# Patient Record
Sex: Male | Born: 1995 | Race: White | Hispanic: No | Marital: Single | Smoking: Never smoker
Health system: Southern US, Community
[De-identification: ages and names within clinical notes are randomized; demographics above are authoritative.]

## PROBLEM LIST (undated history)

## (undated) DIAGNOSIS — J45909 Unspecified asthma, uncomplicated: Secondary | ICD-10-CM

---
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
## 2014-05-31 DIAGNOSIS — K5900 Constipation, unspecified: Secondary | ICD-10-CM | POA: Insufficient documentation

## 2016-03-16 NOTE — ED Provider Notes (Signed)
 History   Chief Complaint  Patient presents with  . Abdominal Pain   Patient presents to the ED POV accompanied by friend in room 6 with a chief complaint of abdominal pain. Pt reports that he has been having L lower abd cramping consistently for the last 5 night.  Pt reports that he has history of constipation and is currently feeling like he may be constipated.  Pt also reports that he has a headache    The history is provided by the patient. No language interpreter was used.  Abdominal Pain  Pain location:  LLQ Pain quality: cramping   Pain radiates to:  Does not radiate Pain severity:  Mild Onset quality:  Gradual Duration:  5 days Timing:  Constant Progression:  Worsening Chronicity:  Recurrent Relieved by:  Nothing Worsened by:  Nothing Ineffective treatments:  None tried Associated symptoms: constipation, nausea and vomiting   Associated symptoms: no chest pain, no chills, no cough, no diarrhea, no dysuria, no fever and no shortness of breath     Past Medical History:  Diagnosis Date  . Abnormal renal ultrasound   . Allergic rhinitis 04/21/2011  . Constipation 05/31/2014  . Hypertensive disorder   . Irritable bowel syndrome   . Obesity   . Proteinuria     Past Surgical History:  Procedure Laterality Date  . RENAL BIOPSY    . TYMPANOSTOMY TUBE PLACEMENT      Family History  Problem Relation Age of Onset  . Hypertension Mother   . Diabetes Mother   . Hypertension Maternal Aunt   . Hypertension Maternal Uncle   . Heart disease Maternal Uncle     MI at age 17  . Hypertension Maternal Grandmother   . Diabetes Maternal Grandmother   . Hypertension Maternal Grandfather   . Diabetes Maternal Grandfather   . Stroke Maternal Grandfather     age 72  . Heart disease Maternal Uncle     MI at age 80    Social History  Substance Use Topics  . Smoking status: Passive Smoke Exposure - Never Smoker  . Smokeless tobacco: Never Used     Comment: exposed to 2nd hand  smoke  . Alcohol use Not on file     Review of Systems  Constitutional: Negative for chills and fever.  Respiratory: Negative for cough and shortness of breath.   Cardiovascular: Negative for chest pain and leg swelling.  Gastrointestinal: Positive for constipation, nausea and vomiting. Negative for abdominal pain and diarrhea.  Genitourinary: Negative for difficulty urinating and dysuria.  Musculoskeletal: Negative for arthralgias and myalgias.  Neurological: Negative for weakness, numbness and headaches.  All other systems reviewed and are negative.     Physical Exam   ED Triage Vitals [03/16/16 1341]  BP 146/91  MAP (mmHg)   Pulse 89  Resp 18  Temp 97.5 F (36.4 C)  SpO2 98 %    Physical Exam  Constitutional: He appears well-developed and well-nourished. No distress.  HENT:  Head: Normocephalic and atraumatic.  Nose: Nose normal.  Mouth/Throat: Oropharynx is clear and moist. Mucous membranes are dry.  Eyes: Conjunctivae are normal. No scleral icterus.  Neck: Normal range of motion. Neck supple.  Cardiovascular: Normal rate and regular rhythm.   Pulmonary/Chest: Effort normal and breath sounds normal.  Abdominal: Soft. There is no tenderness.  Musculoskeletal: He exhibits no edema or deformity.  Neurological: He is alert. He exhibits normal muscle tone.  Skin: Skin is warm and dry.  Psychiatric: He has a  normal mood and affect. His behavior is normal.  Nursing note and vitals reviewed.   ED Course  Procedures  Labs Reviewed  LIPASE - Abnormal; Notable for the following:       Result Value   Lipase 60 (*)    All other components within normal limits  UA, MICROSCOPIC IF INDICATED BY DIPSTICK - Abnormal; Notable for the following:    Urine Bilirubin Small (*)    All other components within normal limits  CBC WITH AUTO DIFFERENTIAL PANEL - Abnormal; Notable for the following:    WBC 10.6 (*)    RBC 5.66 (*)    Monocyte % 17 (*)    Monocyte Absolute 1.8 (*)     All other components within normal limits  COMPREHENSIVE METABOLIC PANEL - Normal  CBC AND DIFFERENTIAL   Narrative:    The following orders were created for panel order CBC and Differential. Procedure                               Abnormality         Status                    ---------                               -----------         ------                    CBC and Differential[[id number]]         Abnormal            Final result               Please view results for these tests on the individual orders.    No orders to display     MDM 21 year old male with history of irritable bowel syndrome presents in the setting of concern for constipation and left lower quadrant abdominal pain.  Patient reports her last 3 days he has had intermittent loose stools but he feels like he is constipated.  Patient reports some mild pain in left lower quadrant additionally difficulty sleeping due to discomfort.  Patient denies fevers, dysuria, blood in stool, any medications.  On arrival patient was hemodynamically stable and afebrile.  Examination patient is resting comfortably and had no significant tenderness palpation of abdomen.  Differential diagnosis includes constipation, diverticulitis, pancreatitis, gastritis, enteritis, worsening of IBS.  Patient given IV fluids and pain medication and basic laboratory analysis obtained.  No significant electrolyte Routledge noted no signs of pancreatitis noted no significant anemia noted.  Mild elevation white blood cell count noted.  Patient was resting comfortably on reassessment and in setting this finding I have concern for possible constipation versus IBS.  Patient's presentation not consistent with diverticulitis and do not believe patient requires further invasive imaging.  Patient reported improvement with fluids and pain medications and will discharge patient home with plan to increase MiraLAX use and follow-up with gastroenterologist for further  management of this condition.  Additionally patient given instructions on sleep hygiene as well as plan to use melatonin for further management of sleep disorder.  Strict return precautions given and patient stable at time of discharge  Clinical Impression:  1. Constipation, unspecified constipation type   2. LLQ abdominal pain   3. Insomnia, unspecified type  ED Disposition    ED Disposition Condition Comment   Discharge  Xavyer Steenson Odden discharge to home/self care.           This document serves as a record of services personally performed by Dr.Seymore. It was created on their behalf by Michaelle Jenkins Holt, Medical Scribe, a trained medical scribe. The creation of this record is the provider's dictation and/or activities during the visit.   Electronically signed by: Michaelle Jenkins Holt, Medical Scribe 03/16/2016 3:33 PM     Electronically signed by: Prentice Ivonne Shearing, MD 03/16/16 [phone]

## 2021-02-07 NOTE — ED Triage Notes (Signed)
 Pt with covid 3 weeks ago, having asthma issues with cold weather. Pt states he is having shob and chest tightness.

## 2021-02-25 ENCOUNTER — Observation Stay (HOSPITAL_COMMUNITY)
Admission: EM | Admit: 2021-02-25 | Discharge: 2021-02-27 | Disposition: A | Discharge status: Home / Self Care | Payer: Self-pay | Attending: Emergency Medicine | Admitting: Emergency Medicine

## 2021-02-25 ENCOUNTER — Emergency Department (HOSPITAL_COMMUNITY): Payer: Self-pay

## 2021-02-25 ENCOUNTER — Encounter (HOSPITAL_COMMUNITY): Payer: Self-pay | Admitting: Emergency Medicine

## 2021-02-25 DIAGNOSIS — Z20822 Contact with and (suspected) exposure to covid-19: Secondary | ICD-10-CM | POA: Insufficient documentation

## 2021-02-25 DIAGNOSIS — I10 Essential (primary) hypertension: Secondary | ICD-10-CM | POA: Insufficient documentation

## 2021-02-25 DIAGNOSIS — J4551 Severe persistent asthma with (acute) exacerbation: Secondary | ICD-10-CM

## 2021-02-25 DIAGNOSIS — Z87891 Personal history of nicotine dependence: Secondary | ICD-10-CM | POA: Insufficient documentation

## 2021-02-25 DIAGNOSIS — R739 Hyperglycemia, unspecified: Secondary | ICD-10-CM | POA: Insufficient documentation

## 2021-02-25 DIAGNOSIS — J9601 Acute respiratory failure with hypoxia: Secondary | ICD-10-CM | POA: Insufficient documentation

## 2021-02-25 DIAGNOSIS — J45901 Unspecified asthma with (acute) exacerbation: Principal | ICD-10-CM | POA: Diagnosis present

## 2021-02-25 HISTORY — DX: Unspecified asthma, uncomplicated: J45.909

## 2021-02-25 LAB — BASIC METABOLIC PANEL
Anion gap: 5 (ref 5–15)
BUN: 14 mg/dL (ref 6–20)
CO2: 25 mmol/L (ref 22–32)
Calcium: 9.1 mg/dL (ref 8.9–10.3)
Chloride: 105 mmol/L (ref 98–111)
Creatinine, Ser: 0.66 mg/dL (ref 0.61–1.24)
GFR, Estimated: >60 mL/min (ref >60)
Glucose, Bld: 131 mg/dL — ABNORMAL HIGH (ref 70–99)
Potassium: 4 mmol/L (ref 3.5–5.1)
Sodium: 135 mmol/L (ref 135–145)

## 2021-02-25 LAB — CBC WITH DIFFERENTIAL/PLATELET
Abs Immature Granulocytes: 0.02 10*3/uL (ref 0.00–0.07)
Basophils Absolute: 0.1 10*3/uL (ref 0.0–0.1)
Basophils Relative: 1 %
Eosinophils Absolute: 2 10*3/uL — ABNORMAL HIGH (ref 0.0–0.5)
Eosinophils Relative: 21 %
HCT: 47.3 % (ref 39.0–52.0)
Hemoglobin: 15.2 g/dL (ref 13.0–17.0)
Immature Granulocytes: 0 %
Lymphocytes Relative: 12 %
Lymphs Abs: 1.1 10*3/uL (ref 0.7–4.0)
MCH: 27.2 pg (ref 26.0–34.0)
MCHC: 32.1 g/dL (ref 30.0–36.0)
MCV: 84.6 fL (ref 80.0–100.0)
Monocytes Absolute: 1 10*3/uL (ref 0.1–1.0)
Monocytes Relative: 10 %
Neutro Abs: 5.5 10*3/uL (ref 1.7–7.7)
Neutrophils Relative %: 56 %
Platelets: 223 10*3/uL (ref 150–400)
RBC: 5.59 MIL/uL (ref 4.22–5.81)
RDW: 13.2 % (ref 11.5–15.5)
WBC: 9.7 10*3/uL (ref 4.0–10.5)
nRBC: 0 % (ref 0.0–0.2)

## 2021-02-25 LAB — RESP PANEL BY RT-PCR (FLU A&B, COVID) ARPGX2
Influenza A by PCR: NEGATIVE
Influenza B by PCR: NEGATIVE
SARS Coronavirus 2 by RT PCR: NEGATIVE

## 2021-02-25 MED ORDER — METHYLPREDNISOLONE SODIUM SUCC 125 MG IJ SOLR
60.0000 mg | Freq: Two times a day (BID) | INTRAMUSCULAR | Status: DC
  Administered 2021-02-26: 60 mg via INTRAVENOUS
  Filled 2021-02-25: qty 2

## 2021-02-25 MED ORDER — ALBUTEROL SULFATE (2.5 MG/3ML) 0.083% IN NEBU
10.0000 mg/h | INHALATION_SOLUTION | Freq: Once | RESPIRATORY_TRACT | Status: AC
  Administered 2021-02-25: 10 mg/h via RESPIRATORY_TRACT
  Filled 2021-02-25: qty 3

## 2021-02-25 MED ORDER — MAGNESIUM SULFATE 2 GM/50ML IV SOLN
2.0000 g | Freq: Once | INTRAVENOUS | Status: AC
  Administered 2021-02-25: 2 g via INTRAVENOUS
  Filled 2021-02-25: qty 50

## 2021-02-25 MED ORDER — SODIUM CHLORIDE 0.9 % IV SOLN
1.0000 g | INTRAVENOUS | Status: DC
  Administered 2021-02-25: 1 g via INTRAVENOUS
  Filled 2021-02-25 (×2): qty 10

## 2021-02-25 MED ORDER — METHYLPREDNISOLONE SODIUM SUCC 125 MG IJ SOLR
125.0000 mg | Freq: Once | INTRAMUSCULAR | Status: AC
  Administered 2021-02-25: 125 mg via INTRAVENOUS
  Filled 2021-02-25: qty 2

## 2021-02-25 MED ORDER — ALBUTEROL SULFATE (2.5 MG/3ML) 0.083% IN NEBU
10.0000 mg/h | INHALATION_SOLUTION | RESPIRATORY_TRACT | Status: DC
  Administered 2021-02-25: 10 mg/h via RESPIRATORY_TRACT
  Filled 2021-02-25: qty 12

## 2021-02-25 MED ORDER — ENOXAPARIN SODIUM 60 MG/0.6ML IJ SOSY
60.0000 mg | PREFILLED_SYRINGE | INTRAMUSCULAR | Status: DC
  Administered 2021-02-26: 60 mg via SUBCUTANEOUS
  Filled 2021-02-25 (×2): qty 0.6

## 2021-02-25 MED ORDER — GUAIFENESIN ER 600 MG PO TB12
1200.0000 mg | ORAL_TABLET | Freq: Two times a day (BID) | ORAL | Status: DC
  Administered 2021-02-25 – 2021-02-27 (×4): 1200 mg via ORAL
  Filled 2021-02-25 (×4): qty 2

## 2021-02-25 MED ORDER — IPRATROPIUM-ALBUTEROL 0.5-2.5 (3) MG/3ML IN SOLN
3.0000 mL | Freq: Once | RESPIRATORY_TRACT | Status: AC
Start: 2021-02-25 — End: 2021-02-25
  Administered 2021-02-25: 3 mL via RESPIRATORY_TRACT
  Filled 2021-02-25: qty 3

## 2021-02-25 MED ORDER — SODIUM CHLORIDE 3 % IN NEBU
4.0000 mL | INHALATION_SOLUTION | Freq: Two times a day (BID) | RESPIRATORY_TRACT | Status: DC
  Filled 2021-02-25: qty 4

## 2021-02-25 MED ORDER — IPRATROPIUM-ALBUTEROL 0.5-2.5 (3) MG/3ML IN SOLN: 3.0000 mL | Freq: Four times a day (QID) | RESPIRATORY_TRACT | Status: DC

## 2021-02-25 MED ORDER — AZITHROMYCIN 250 MG PO TABS
250.0000 mg | ORAL_TABLET | Freq: Every day | ORAL | Status: DC
  Administered 2021-02-26: 250 mg via ORAL
  Filled 2021-02-25: qty 1

## 2021-02-25 MED ORDER — IPRATROPIUM BROMIDE 0.02 % IN SOLN
0.5000 mg | Freq: Once | RESPIRATORY_TRACT | Status: AC
  Administered 2021-02-25: 0.5 mg via RESPIRATORY_TRACT
  Filled 2021-02-25: qty 2.5

## 2021-02-25 MED ORDER — SODIUM CHLORIDE 3 % IN NEBU
4.0000 mL | INHALATION_SOLUTION | Freq: Two times a day (BID) | RESPIRATORY_TRACT | Status: AC
Start: 2021-02-25 — End: 2021-02-27
  Administered 2021-02-26 – 2021-02-27 (×3): 4 mL via RESPIRATORY_TRACT
  Filled 2021-02-25 (×4): qty 4

## 2021-02-25 MED ORDER — AZITHROMYCIN 250 MG PO TABS
500.0000 mg | ORAL_TABLET | Freq: Every day | ORAL | Status: AC
  Administered 2021-02-25: 500 mg via ORAL
  Filled 2021-02-25: qty 2

## 2021-02-25 MED ORDER — ACETAMINOPHEN 325 MG PO TABS: 650.0000 mg | ORAL_TABLET | Freq: Four times a day (QID) | ORAL | Status: DC | PRN

## 2021-02-25 MED ORDER — ONDANSETRON HCL 4 MG/2ML IJ SOLN: 4.0000 mg | Freq: Four times a day (QID) | INTRAMUSCULAR | Status: DC | PRN

## 2021-02-25 MED ORDER — ALBUTEROL SULFATE (2.5 MG/3ML) 0.083% IN NEBU
10.0000 mg/h | INHALATION_SOLUTION | Freq: Once | RESPIRATORY_TRACT | Status: AC
  Administered 2021-02-25: 10 mg/h via RESPIRATORY_TRACT
  Filled 2021-02-25: qty 3
  Filled 2021-02-25: qty 12

## 2021-02-25 MED ORDER — INSULIN ASPART 100 UNIT/ML IJ SOLN
0.0000 [IU] | Freq: Three times a day (TID) | INTRAMUSCULAR | Status: DC
  Filled 2021-02-25: qty 0.06

## 2021-02-25 NOTE — ED Triage Notes (Signed)
Patient c/o multiple recent asthma exacerbations. States using inhaler with some relief.

## 2021-02-25 NOTE — H&P (Signed)
History and Physical  Cody Ward YWV:[id number] DOB: 1995/03/01 DOA: 02/25/2021  Referring physician: Renne Crigler, EDP-PA  PCP: Pcp, No  Outpatient Specialists: None. Patient coming from: Home.  Chief Complaint: Shortness of breath.  HPI: Cody Ward is a 26 y.o. male with medical history significant for hypertension, GERD, asthma, tobacco use disorder, quit tobacco use and switched to vaping, obesity, who presented to [facility] ED with complaints of sudden onset shortness of breath similar but worse than previous asthma attacks.  Associated with a productive cough and chest congestion for weeks.  Reports having uncontrolled asthma for the past 2 years with frequent recurrent attacks.  Endorses use of vaping, started 2 years ago.  He is not aware of any particular triggers but states that when he lies down flat or when he is exposed to cold air his asthma tends to flare up.  He has a rescue inhaler.  Does not follow with pulmonary.  He presented to the ED for further evaluation and management of his symptomatology.  Upon presentation to the ED, O2 saturation in the mid 80s on room air.  Not on oxygen supplementation at baseline.  Patient placed on 2 L to maintain O2 saturation greater than 90%.  [facility], hospitalist team, called to admit.  ED Course: Temperature 97.8.  BP 148/78, pulse 93, respiratory rate 20, O2 saturation 90% on 2 L.  Lab studies are essentially unremarkable.  Review of Systems: Review of systems as noted in the HPI. All other systems reviewed and are negative.   Past Medical History:  Diagnosis Date   Asthma    History reviewed. No pertinent surgical history.  Social History:  has no history on file for tobacco use, alcohol use, and drug use.   No Known Allergies  Family history:   Prior to Admission medications   Not on File    Physical Exam: BP (!) 135/91    Pulse 90    Temp 97.8 F (36.6 C) (Oral)    Resp 20    Ht 5\' 11"  (1.803 m)    Wt 127 kg    SpO2 90%     BMI 39.05 kg/m   General: 26 y.o. year-old male well developed well nourished in no acute distress.  Alert and oriented x3. Cardiovascular: Regular rate and rhythm with no rubs or gallops.  No thyromegaly or JVD noted.  Trace lower extremity edema. 2/4 pulses in all 4 extremities. Respiratory: Diffuse wheezing bilaterally. Good inspiratory effort. Abdomen: Soft nontender nondistended with normal bowel sounds x4 quadrants. Muskuloskeletal: No cyanosis or clubbing.  Trace edema noted bilaterally Neuro: CN II-XII intact, strength, sensation, reflexes Skin: No ulcerative lesions noted or rashes Psychiatry: Judgement and insight appear normal. Mood is appropriate for condition and setting          Labs on Admission:  Basic Metabolic Panel: Recent Labs  Lab 02/25/21 1118  NA 135  K 4.0  CL 105  CO2 25  GLUCOSE 131*  BUN 14  CREATININE 0.66  CALCIUM 9.1   Liver Function Tests: No results for input(s): AST, ALT, ALKPHOS, BILITOT, PROT, ALBUMIN in the last 168 hours. No results for input(s): LIPASE, AMYLASE in the last 168 hours. No results for input(s): AMMONIA in the last 168 hours. CBC: Recent Labs  Lab 02/25/21 1118  WBC 9.7  NEUTROABS 5.5  HGB 15.2  HCT 47.3  MCV 84.6  PLT 223   Cardiac Enzymes: No results for input(s): CKTOTAL, CKMB, CKMBINDEX, TROPONINI in the last 168 hours.  BNP (last 3 results) No results for input(s): BNP in the last 8760 hours.  ProBNP (last 3 results) No results for input(s): PROBNP in the last 8760 hours.  CBG: No results for input(s): GLUCAP in the last 168 hours.  Radiological Exams on Admission: DG Chest 2 View  Result Date: 02/25/2021 CLINICAL DATA:  26 year old male with history of dyspnea. EXAM: CHEST - 2 VIEW COMPARISON:  Chest x-ray 02/07/2021. FINDINGS: Lung volumes are normal. No consolidative airspace disease. No pleural effusions. No pneumothorax. No pulmonary nodule or mass noted. Pulmonary vasculature and the  cardiomediastinal silhouette are within normal limits. IMPRESSION: No radiographic evidence of acute cardiopulmonary disease. Electronically Signed   By: Trudie Reed M.D.   On: 02/25/2021 11:35    EKG: I independently viewed the EKG done and my findings are as followed: None available at the time of the visit.  Assessment/Plan Present on Admission:  Asthma exacerbation  Principal Problem:   Asthma exacerbation  Acute asthma exacerbation with concern for chronic bronchitis Likely triggered by vaping Received IV steroids, bronchodilators in the ED, continue. Start IV Solu-Medrol 60 mg twice daily DuoNeb every 6 hours Endorses a productive cough and chest congestion for weeks:  Start pulmonary toilet/hypersaline nebs/Mucinex/chest physiotherapy.  Z-Pak, Rocephin x3 days.  Acute hypoxemic respiratory failure secondary to above Not on oxygen supplementation at baseline. Currently on 2 L to maintain O2 saturation greater than 90% Wean off oxygen supplementation as tolerated Personally reviewed chest x-ray done on admission which shows no acute cardiopulmonary findings.  Essential hypertension BP is not at goal, elevated Norvasc 5 mg daily Closely monitor vital signs  Obesity BMI 39 Recommend weight loss outpatient with regular physical activity and healthy dieting.  Hyperglycemia likely exacerbated by IV steroids Sensitive insulin sliding scale for hyperglycemia while on IV steroids Obtain A1c   DVT prophylaxis: Subcu Lovenox daily  Code Status: Full code  Family Communication: Significant other at bedside  Disposition Plan: Admitted to telemetry unit  Consults called: Please consult pulmonary in the morning  Admission status: Observation status.   Status is: Observation        Darlin Drop MD [facility] Pager [phone]  If 7PM-7AM, please contact night-coverage [email or web address] Password [facility]  02/25/2021, 5:27 PM

## 2021-02-25 NOTE — ED Provider Notes (Signed)
Signout from [employer] at shift change. Briefly, patient presents for asthma exacerbation with hypoxia.   Plan: Currently receiving continuous albuterol nebulizer.  Will reassess after completion.   4:07 PM Reassessment performed. Patient appears slightly uncomfortable.  He is talking in full sentences.  He has significant wheezing on exam.  On pulse ox while standing in the room he is 90-94%.  Labs and imaging personally reviewed and interpreted including: Chest x-ray, agree no acute findings.   Most current vital signs reviewed and are as follows: BP (!) 135/91    Pulse 90    Temp 97.8 F (36.6 C) (Oral)    Resp 20    Ht 5\' 11"  (1.803 m)    Wt 127 kg    SpO2 90%    BMI 39.05 kg/m   Plan: We will now check pulse oximetry with ambulation.  4:32 PM O2 sat 86-87% while moving around the room.  Patient placed on 2 L O2 by cannula.  His work of breathing is not bad.  Shared decision-making discussion had with patient.  Recommended admission to hospital.  Also discussed discharge AGAINST MEDICAL ADVICE with medication for home.  But given hypoxia, patient agrees to admission.  4:49 PM consulted with Dr. , [facility] hospital[facility], who will see patient.      Margo Aye, PA-C 02/25/21 1649    [place], 02/27/21, MD 02/25/21 [phone]

## 2021-02-25 NOTE — ED Provider Triage Note (Signed)
Emergency Medicine Provider Triage Evaluation Note  Cody Ward , a 26 y.o. male  was evaluated in triage.  Pt complains of difficulty breathing.  Patient reports history of recurrent asthma exacerbations.  Patient reports this episode started yesterday and has progressively worsened through his shift at work.  Patient reports he does have albuterol inhaler at home which he has been using without significant relief.  Denies recent illness.  Review of Systems  Positive: As above Negative: As above  Physical Exam  BP (!) 148/85 (BP Location: Left Arm)    Pulse 88    Temp 97.8 F (36.6 C) (Oral)    Resp 20    Ht 5\' 11"  (1.803 m)    Wt 127 kg    SpO2 92%    BMI 39.05 kg/m  Gen:   Awake, no distress   Resp:  Normal effort  MSK:   Moves extremities without difficulty  Other:    Medical Decision Making  Medically screening exam initiated at 11:09 AM.  Appropriate orders placed.  Cody Ward was informed that the remainder of the evaluation will be completed by another provider, this initial triage assessment does not replace that evaluation, and the importance of remaining in the ED until their evaluation is complete.     Cody Crane, PA-C 02/25/21 1110

## 2021-02-25 NOTE — ED Provider Notes (Signed)
I provided a substantive portion of the care of this patient.  I personally performed the entirety of the exam for this encounter.     Patient has history of asthma.  He reports he had a period from childhood asthma until adulthood when it had improved and he was not having flareups.  He reports for about 2 years now he has been getting extensively more serious flareups of asthma.  He reports it seems like his home inhalers just are not working anymore.  Reports over the past 3 days he has had difficulty sleeping at night.  He reports he at first was waking up coughing, now he spent most of the night awake because it was difficult to breathe.  He reports he is getting extremely fatigued at work due to shortness of breath.  He denies he had any fever that he is aware of.  No productive cough.  No lower extremity swelling or calf pain.  I am seeing the patient after he is already had a continuous nebulizer treatment and 1 DuoNeb.  At this time he is getting another continuous treatment started by respiratory.  Patient is alert with clear mental status.  He does have increased work of breathing but does not show signs of imminent fatigue.  He is speaking in moderate full sentences.  Airway patent.  Has diffuse inspiratory expiratory wheeze throughout the lung fields.  He is aerating to the bases.  Abdomen soft.  Lower extremities no peripheral edema.  No calf tenderness.  Patient is having significantly worsening and persistent asthma.  Due to financial concerns patient is requesting that he get treatment in the emergency department and be discharged to complete treatment at home.  At this time (15: 05) I have examined the patient and advised him I see very low probability of him improving to the extent that he could be safely discharged.  He is currently getting a second continuous treatment.  We will continue to monitor closely.  Unless the patient makes DRAMATIC improvement, he is not safe for discharge due  to risk of death.  Patient is aware that this is my opinion and leaving is not safe.  He agrees with inpatient treatment under these conditions.   Arby Barrette, MD 02/25/21 1511

## 2021-02-25 NOTE — ED Notes (Signed)
Patient ambulated to the bathroom and around his room. O2 sats remained in the upper 80's. He reports feeling better than before.

## 2021-02-25 NOTE — ED Provider Notes (Signed)
[place] DEPT Provider Note   CSN: [id number] Arrival date & time: 02/25/21  1032     History  Chief Complaint  Patient presents with   Asthma    Cody Ward is a 26 y.o. male with a history of hypertension, asthma, GERD.  Patient presents emergency department with a chief complaint of shortness of breath and wheezing.  Patient states that he has been having frequent asthma exacerbations since December 2022.  Patient states that his breathing became worse last night.  Patient states that he had significant wheezing.  Patient uses prescribed albuterol inhaler with minimal improvement in his symptoms.  Patient endorses rhinorrhea, nasal congestion, and nonproductive cough.  Patient denies any chest pain, leg swelling or tenderness, hemoptysis, fever.  Patient endorses using a nicotine vaporizer.  Reports former smoker however has not smoked in 2 years.   Asthma Associated symptoms include shortness of breath. Pertinent negatives include no chest pain, no abdominal pain and no headaches.      Home Medications Prior to Admission medications   Not on File      Allergies    Patient has no known allergies.    Review of Systems   Review of Systems  Constitutional:  Positive for chills. Negative for fever.  HENT:  Positive for congestion and rhinorrhea. Negative for sore throat.   Eyes:  Negative for visual disturbance.  Respiratory:  Positive for cough, chest tightness, shortness of breath and wheezing.   Cardiovascular:  Negative for chest pain, palpitations and leg swelling.  Gastrointestinal:  Negative for abdominal pain, nausea and vomiting.  Musculoskeletal:  Negative for back pain and neck pain.  Skin:  Negative for color change and rash.  Neurological:  Negative for dizziness, syncope, light-headedness and headaches.  Psychiatric/Behavioral:  Negative for confusion.    Physical Exam Updated Vital Signs BP (!) 148/85 (BP Location: Left  Arm)    Pulse 88    Temp 97.8 F (36.6 C) (Oral)    Resp 20    Ht 5\' 11"  (1.803 m)    Wt 127 kg    SpO2 92%    BMI 39.05 kg/m  Physical Exam Vitals and nursing note reviewed.  Constitutional:      General: He is not in acute distress.    Appearance: He is not ill-appearing, toxic-appearing or diaphoretic.  HENT:     Head: Normocephalic.  Eyes:     General: No scleral icterus.       Right eye: No discharge.        Left eye: No discharge.  Cardiovascular:     Rate and Rhythm: Normal rate.  Pulmonary:     Effort: No tachypnea, bradypnea or respiratory distress.     Breath sounds: Examination of the right-upper field reveals wheezing. Examination of the left-upper field reveals wheezing. Examination of the right-middle field reveals wheezing. Examination of the left-middle field reveals wheezing. Examination of the right-lower field reveals wheezing. Examination of the left-lower field reveals wheezing. Wheezing present.     Comments: Inspiratory and expiratory wheezing noted to all lung fields.  Shallow breaths.  Speaks in full sentences without difficulty Skin:    General: Skin is warm and dry.  Neurological:     General: No focal deficit present.     Mental Status: He is alert.     GCS: GCS eye subscore is 4. GCS verbal subscore is 5. GCS motor subscore is 6.  Psychiatric:        Behavior: Behavior  is cooperative.    ED Results / Procedures / Treatments   Labs (all labs ordered are listed, but only abnormal results are displayed) Labs Reviewed  CBC WITH DIFFERENTIAL/PLATELET - Abnormal; Notable for the following components:      Result Value   Eosinophils Absolute 2.0 (*)    All other components within normal limits  BASIC METABOLIC PANEL - Abnormal; Notable for the following components:   Glucose, Bld 131 (*)    All other components within normal limits  RESP PANEL BY RT-PCR (FLU A&B, COVID) [id number]    EKG None  Radiology DG Chest 2 View  Result Date:  02/25/2021 CLINICAL DATA:  26 year old male with history of dyspnea. EXAM: CHEST - 2 VIEW COMPARISON:  Chest x-ray 02/07/2021. FINDINGS: Lung volumes are normal. No consolidative airspace disease. No pleural effusions. No pneumothorax. No pulmonary nodule or mass noted. Pulmonary vasculature and the cardiomediastinal silhouette are within normal limits. IMPRESSION: No radiographic evidence of acute cardiopulmonary disease. Electronically Signed   By: Vinnie Langton M.D.   On: 02/25/2021 11:35    Procedures .Critical Care Performed by: Loni Beckwith, PA-C Authorized by: Loni Beckwith, PA-C   Critical care provider statement:    Critical care time (minutes):  30   Critical care was necessary to treat or prevent imminent or life-threatening deterioration of the following conditions:  Respiratory failure   Critical care was time spent personally by me on the following activities:  Development of treatment plan with patient or surrogate, evaluation of patient's response to treatment, examination of patient, ordering and review of laboratory studies, ordering and review of radiographic studies, ordering and performing treatments and interventions, pulse oximetry, re-evaluation of patient's condition and review of old charts Comments:     Patient has asthma exacerbation requiring magnesium, Solu-Medrol, and multiple rounds of albuterol nebulizer treatments    Medications Ordered in ED Medications  magnesium sulfate IVPB 2 g 50 mL (2 g Intravenous New Bag/Given 02/25/21 1350)  albuterol (PROVENTIL,VENTOLIN) solution continuous neb (has no administration in time range)  ipratropium (ATROVENT) nebulizer solution 0.5 mg (has no administration in time range)  ipratropium-albuterol (DUONEB) 0.5-2.5 (3) MG/3ML nebulizer solution 3 mL (3 mLs Nebulization Given 02/25/21 1137)  methylPREDNISolone sodium succinate (SOLU-MEDROL) 125 mg/2 mL injection 125 mg (125 mg Intravenous Given 02/25/21 1346)   albuterol (PROVENTIL) (2.5 MG/3ML) 0.083% nebulizer solution (10 mg/hr Nebulization Given 02/25/21 1337)    ED Course/ Medical Decision Making/ A&P                           Medical Decision Making  This patient presents to the ED for concern of wheezing and shortness of breath, this involves an extensive number of treatment options, and is a complaint that carries with it a high risk of complications and morbidity.  The differential diagnosis includes but is not limited to asthma exacerbation, acute CHF, COVID-19, influenza, viral URI, bronchitis.   Co morbidities that complicate the patient evaluation  Hypertension, asthma   Additional history obtained:  Additional history obtained from fianc at bedside External records from outside source obtained and reviewed including previous provider notes, lab work   Lab Tests:  I Ordered, and personally interpreted labs.  The pertinent results include:   Negative for influenza COVID-19 BMP unremarkable CBC shows eosinophils increased at 2.0   Imaging Studies ordered:  I ordered imaging studies including chest x-ray I independently visualized and interpreted imaging which showed no active cardiopulmonary  disease I agree with the radiologist interpretation    Medicines ordered and prescription drug management:  I ordered medication including albuterol, magnesium, and Solu-Medrol for wheezing Reevaluation of the patient after these medicines showed that the patient improved I have reviewed the patients home medicines and have made adjustments as needed   Critical Interventions:  Solu-Medrol, magnesium, multiple rounds of albuterol nebulizers for asthma exacerbation   Problem List / ED Course:  Shortness of breath and wheezing Patient has history of asthma.  States that he has been dealing with frequent asthma exacerbation since December.  Shortness of breath became worse yesterday evening. On my exam patient is noted to  have respiratory expiratory wheezing to all lung fields.  Patient has shallow breaths however is able to speak in full complete sentences.  Oxygen saturation noted to be 90 to 95% on room air. Patient ordered Solu-Medrol, magnesium, continuous albuterol x1 hour.  Patient reports improvement in chest tightness however still has expiratory expiratory wheezing to all lung fields on reexamination.  Will order second round of continuous nebulizer.  Suspect that patient will need to be admitted to the hospital due to asthma exacerbation with oxygen saturations 89 to 92% on room air.   Patient care transferred to PA Gieple at the end of my shift. Patient presentation, ED course, and plan of care discussed with review of all pertinent labs and imaging. Please see his/her note for further details regarding further ED course and disposition.  Patient was discussed with and evaluated by Dr. Vallery Ridge.    Reevaluation:  After the interventions noted above, I reevaluated the patient and found that they have :improved   Social Determinants of Health:  Patient reports that he does not have any primary care provider.   Dispostion:  After consideration of the diagnostic results and the patients response to treatment, I feel that the patent would benefit from admission.          Final Clinical Impression(s) / ED Diagnoses Final diagnoses:  None    Rx / DC Orders ED Discharge Orders     None         Loni Beckwith, PA-C 02/25/21 1545    Charlesetta Shanks, MD 02/25/21 [phone]

## 2021-02-26 ENCOUNTER — Other Ambulatory Visit: Payer: Self-pay

## 2021-02-26 ENCOUNTER — Telehealth: Payer: Self-pay | Admitting: Internal Medicine

## 2021-02-26 ENCOUNTER — Encounter (HOSPITAL_COMMUNITY): Payer: Self-pay | Admitting: Internal Medicine

## 2021-02-26 ENCOUNTER — Observation Stay (HOSPITAL_COMMUNITY): Payer: Self-pay

## 2021-02-26 DIAGNOSIS — J4551 Severe persistent asthma with (acute) exacerbation: Secondary | ICD-10-CM

## 2021-02-26 LAB — MAGNESIUM: Magnesium: 2.3 mg/dL (ref 1.7–2.4)

## 2021-02-26 LAB — BASIC METABOLIC PANEL
Anion gap: 8 (ref 5–15)
BUN: 12 mg/dL (ref 6–20)
CO2: 23 mmol/L (ref 22–32)
Calcium: 9.7 mg/dL (ref 8.9–10.3)
Chloride: 107 mmol/L (ref 98–111)
Creatinine, Ser: 0.46 mg/dL — ABNORMAL LOW (ref 0.61–1.24)
GFR, Estimated: >60 mL/min (ref >60)
Glucose, Bld: 140 mg/dL — ABNORMAL HIGH (ref 70–99)
Potassium: 4.3 mmol/L (ref 3.5–5.1)
Sodium: 138 mmol/L (ref 135–145)

## 2021-02-26 LAB — CBC
HCT: 46.9 % (ref 39.0–52.0)
Hemoglobin: 15 g/dL (ref 13.0–17.0)
MCH: 27 pg (ref 26.0–34.0)
MCHC: 32 g/dL (ref 30.0–36.0)
MCV: 84.4 fL (ref 80.0–100.0)
Platelets: 249 10*3/uL (ref 150–400)
RBC: 5.56 MIL/uL (ref 4.22–5.81)
RDW: 13.4 % (ref 11.5–15.5)
WBC: 10.9 10*3/uL — ABNORMAL HIGH (ref 4.0–10.5)
nRBC: 0 % (ref 0.0–0.2)

## 2021-02-26 LAB — GLUCOSE, CAPILLARY
Glucose-Capillary: 143 mg/dL — ABNORMAL HIGH (ref 70–99)
Glucose-Capillary: 147 mg/dL — ABNORMAL HIGH (ref 70–99)
Glucose-Capillary: 138 mg/dL — ABNORMAL HIGH (ref 70–99)
Glucose-Capillary: 152 mg/dL — ABNORMAL HIGH (ref 70–99)

## 2021-02-26 LAB — HIV ANTIBODY (ROUTINE TESTING W REFLEX): HIV Screen 4th Generation wRfx: NONREACTIVE

## 2021-02-26 LAB — HEPATIC FUNCTION PANEL
ALT: 23 U/L (ref 0–44)
AST: 22 U/L (ref 15–41)
Albumin: 4.3 g/dL (ref 3.5–5.0)
Alkaline Phosphatase: 52 U/L (ref 38–126)
Bilirubin, Direct: 0.1 mg/dL (ref 0.0–0.2)
Indirect Bilirubin: 0.3 mg/dL (ref 0.3–0.9)
Total Bilirubin: 0.4 mg/dL (ref 0.3–1.2)
Total Protein: 7.4 g/dL (ref 6.5–8.1)

## 2021-02-26 LAB — PHOSPHORUS: Phosphorus: 1.4 mg/dL — ABNORMAL LOW (ref 2.5–4.6)

## 2021-02-26 LAB — HEMOGLOBIN A1C
Hgb A1c MFr Bld: 5.8 % — ABNORMAL HIGH (ref 4.8–5.6)
Mean Plasma Glucose: 119.76 mg/dL

## 2021-02-26 LAB — LACTIC ACID, PLASMA: Lactic Acid, Venous: 3.7 mmol/L (ref 0.5–1.9)

## 2021-02-26 MED ORDER — AZITHROMYCIN 250 MG PO TABS
250.0000 mg | ORAL_TABLET | Freq: Every day | ORAL | Status: DC
  Administered 2021-02-27: 250 mg via ORAL
  Filled 2021-02-26: qty 1

## 2021-02-26 MED ORDER — MOMETASONE FURO-FORMOTEROL FUM 200-5 MCG/ACT IN AERO
2.0000 | INHALATION_SPRAY | Freq: Two times a day (BID) | RESPIRATORY_TRACT | 0 refills | Status: DC
  Filled 2021-02-26: qty 13, 30d supply, fill #0

## 2021-02-26 MED ORDER — SODIUM PHOSPHATES 45 MMOLE/15ML IV SOLN
30.0000 mmol | Freq: Once | INTRAVENOUS | Status: DC
  Filled 2021-02-26: qty 10

## 2021-02-26 MED ORDER — LACTATED RINGERS IV SOLN: INTRAVENOUS | Status: DC

## 2021-02-26 MED ORDER — POTASSIUM & SODIUM PHOSPHATES 280-160-250 MG PO PACK: 2.0000 | PACK | Freq: Three times a day (TID) | ORAL | 0 refills | Status: DC

## 2021-02-26 MED ORDER — ALBUTEROL SULFATE HFA 108 (90 BASE) MCG/ACT IN AERS
2.0000 | INHALATION_SPRAY | Freq: Four times a day (QID) | RESPIRATORY_TRACT | Status: DC | PRN
  Administered 2021-02-27: 2 via RESPIRATORY_TRACT
  Filled 2021-02-26: qty 6.7

## 2021-02-26 MED ORDER — ALBUTEROL SULFATE (2.5 MG/3ML) 0.083% IN NEBU
10.0000 mg/h | INHALATION_SOLUTION | RESPIRATORY_TRACT | 0 refills | Status: DC
  Filled 2021-02-26: qty 75, fill #0

## 2021-02-26 MED ORDER — AZITHROMYCIN 250 MG PO TABS
250.0000 mg | ORAL_TABLET | Freq: Every day | ORAL | 0 refills | Status: DC
  Filled 2021-02-26: qty 3, 3d supply, fill #0

## 2021-02-26 MED ORDER — IPRATROPIUM-ALBUTEROL 0.5-2.5 (3) MG/3ML IN SOLN
3.0000 mL | Freq: Three times a day (TID) | RESPIRATORY_TRACT | Status: DC
  Administered 2021-02-26 – 2021-02-27 (×4): 3 mL via RESPIRATORY_TRACT
  Filled 2021-02-26 (×4): qty 3

## 2021-02-26 MED ORDER — PREDNISONE 20 MG PO TABS: 60.0000 mg | ORAL_TABLET | Freq: Every day | ORAL | Status: DC

## 2021-02-26 MED ORDER — ALBUTEROL SULFATE (2.5 MG/3ML) 0.083% IN NEBU: 10.0000 mg/h | INHALATION_SOLUTION | RESPIRATORY_TRACT | 0 refills | Status: DC

## 2021-02-26 MED ORDER — POTASSIUM & SODIUM PHOSPHATES 280-160-250 MG PO PACK
2.0000 | PACK | Freq: Three times a day (TID) | ORAL | Status: DC
  Filled 2021-02-26 (×2): qty 2

## 2021-02-26 MED ORDER — PREDNISONE 50 MG PO TABS
50.0000 mg | ORAL_TABLET | Freq: Every day | ORAL | Status: DC
  Filled 2021-02-26: qty 1

## 2021-02-26 MED ORDER — IOHEXOL 350 MG/ML SOLN
75.0000 mL | Freq: Once | INTRAVENOUS | Status: AC | PRN
  Administered 2021-02-26: 75 mL via INTRAVENOUS

## 2021-02-26 MED ORDER — SODIUM PHOSPHATES 45 MMOLE/15ML IV SOLN
30.0000 mmol | Freq: Once | INTRAVENOUS | Status: AC
  Administered 2021-02-26: 30 mmol via INTRAVENOUS
  Filled 2021-02-26: qty 10

## 2021-02-26 MED ORDER — MOMETASONE FURO-FORMOTEROL FUM 200-5 MCG/ACT IN AERO
2.0000 | INHALATION_SPRAY | Freq: Two times a day (BID) | RESPIRATORY_TRACT | Status: DC
  Administered 2021-02-26 – 2021-02-27 (×2): 2 via RESPIRATORY_TRACT
  Filled 2021-02-26: qty 8.8

## 2021-02-26 MED ORDER — ALBUTEROL SULFATE HFA 108 (90 BASE) MCG/ACT IN AERS: 2.0000 | INHALATION_SPRAY | Freq: Four times a day (QID) | RESPIRATORY_TRACT | 0 refills | Status: DC | PRN

## 2021-02-26 MED ORDER — AZITHROMYCIN 250 MG PO TABS: ORAL_TABLET | ORAL | 0 refills | Status: DC

## 2021-02-26 MED ORDER — METHYLPREDNISOLONE SODIUM SUCC 125 MG IJ SOLR
125.0000 mg | INTRAMUSCULAR | Status: AC
  Administered 2021-02-26: 125 mg via INTRAVENOUS
  Filled 2021-02-26: qty 2

## 2021-02-26 MED ORDER — BENZONATATE 100 MG PO CAPS: 100.0000 mg | ORAL_CAPSULE | Freq: Three times a day (TID) | ORAL | 0 refills | Status: DC | PRN

## 2021-02-26 MED ORDER — ALBUTEROL SULFATE HFA 108 (90 BASE) MCG/ACT IN AERS
2.0000 | INHALATION_SPRAY | Freq: Four times a day (QID) | RESPIRATORY_TRACT | 0 refills | Status: DC | PRN
Start: 2021-02-26 — End: 2021-05-02
  Filled 2021-02-26: qty 8.5, 25d supply, fill #0

## 2021-02-26 MED ORDER — POTASSIUM & SODIUM PHOSPHATES 280-160-250 MG PO PACK
2.0000 | PACK | Freq: Three times a day (TID) | ORAL | Status: DC
Start: 2021-02-26 — End: 2021-02-26
  Filled 2021-02-26 (×2): qty 2

## 2021-02-26 MED ORDER — SODIUM PHOSPHATES 45 MMOLE/15ML IV SOLN
30.0000 mmol | INTRAVENOUS | Status: AC
  Administered 2021-02-26: 30 mmol via INTRAVENOUS
  Filled 2021-02-26: qty 10

## 2021-02-26 NOTE — Plan of Care (Signed)
  Problem: Clinical Measurements: Goal: Diagnostic test results will improve Outcome: Progressing   Problem: Coping: Goal: Level of anxiety will decrease Outcome: Progressing   Problem: Safety: Goal: Ability to remain free from injury will improve Outcome: Progressing   

## 2021-02-26 NOTE — Progress Notes (Signed)
°   02/26/21 1652  Provider Notification  Provider Name/Title Darlin Drop MD  Date Provider Notified 02/26/21  Time Provider Notified 1652  Notification Type Face-to-face  Notification Reason Critical result  Test performed and critical result Lactic Acid  Date Critical Result Received 04/18/21  Time Critical Result Received 1652  Provider response See new orders  Date of Provider Response 02/26/21  Time of Provider Response  (See Mar)   Face to face encounter MD Margo Aye at bedside. See MAR for new orders.

## 2021-02-26 NOTE — Progress Notes (Signed)
Discharge delayed due to critical lab values which include severe hypophosphatemia 1.4 and lactic acidosis 3.7.  Patient wants to leave so he could go to work tomorrow, but he is still requiring inpatient care.  Explained to the patient that it is imperative that he stays and allows [place] to treat him with the therapies that are already in place.  Advised that if he leaves he will be leaving AGAINST MEDICAL ADVICE.  Patient agrees to stay and wants to be discharged as soon as it is possible.

## 2021-02-26 NOTE — Consult Note (Signed)
NAME:  Cody Ward, MRN:  [id number], DOB:  03-19-1995, LOS: 0 ADMISSION DATE:  02/25/2021, CONSULTATION DATE:  02/26/2021 REFERRING MD:  Dr Irene Pap of [facility], CHIEF COMPLAINT:  Asthma exacerbation   History of Present Illness: -History from review of the chart and also talking to the patient  26 year old male with a long history of asthma since childhood [exposed to multiple cats including 3 cats currently.  Moved to a new house 6 months ago but no mold or mildew or cockroach in that house other than the cats.  Prior smoker but quit 2 tobacco and switched to vaping also with obesity and acid reflux.  He says at baseline he has significant amount of asthma.  Every night he wakes up especially early in the morning with asthma symptoms of chest tightness and cough.  But he only takes albuterol as needed.  Symptoms been particularly high in the last 1-2 years.  However he has only been on prednisone 1 time and one of the ER visit.  This is his first admission.  On this current admission he had progressive worsening of symptoms and required 2 L oxygen for pulse ox of 90%.  Started on steroids and since then has significantly improved.  Currently feels 7-8 out of 10 with 10 being the best.  He wants to go home.  Pulmonary medicine consulted for help with discharge planning with specific question about ideal maintenance regimen to send the patient home on. at the time of pulm evaluation discharge summary has been done.  Flu and COVID PCR negative.  Multiplex respiratory virus panel pending.  Morning phosphorus is extremely low.  Patient is on antibiotics with ceftriaxone and azithromycin.  Past Medical History:    has a past medical history of Asthma.   has no history on file for tobacco use.  History reviewed. No pertinent surgical history.  No Known Allergies   There is no immunization history on file for this patient.  Family History  Family history unknown: Yes     Current  Facility-Administered Medications:    acetaminophen (TYLENOL) tablet 650 mg, 650 mg, Oral, Q6H PRN, Hall, Carole N, DO   albuterol (PROVENTIL) (2.5 MG/3ML) 0.083% nebulizer solution, 10 mg/hr, Nebulization, Continuous, Loni Beckwith, PA-C, Stopped at 02/25/21 1908   [COMPLETED] azithromycin (ZITHROMAX) tablet 500 mg, 500 mg, Oral, Daily, 500 mg at 02/25/21 1855 **FOLLOWED BY** azithromycin (ZITHROMAX) tablet 250 mg, 250 mg, Oral, Daily, Hall, Carole N, DO, 250 mg at 02/26/21 0910   cefTRIAXone (ROCEPHIN) 1 g in sodium chloride 0.9 % 100 mL IVPB, 1 g, Intravenous, Q24H, Hall, Carole N, DO, Stopped at 02/25/21 2019   enoxaparin (LOVENOX) injection 60 mg, 60 mg, Subcutaneous, Q24H, Hall, Carole N, DO   guaiFENesin (MUCINEX) 12 hr tablet 1,200 mg, 1,200 mg, Oral, BID, Hall, Carole N, DO, 1,200 mg at 02/26/21 0910   insulin aspart (novoLOG) injection 0-6 Units, 0-6 Units, Subcutaneous, TID WC, Hall, Carole N, DO   ipratropium-albuterol (DUONEB) 0.5-2.5 (3) MG/3ML nebulizer solution 3 mL, 3 mL, Nebulization, TID, Hall, Carole N, DO, 3 mL at 02/26/21 1349   ondansetron (ZOFRAN) injection 4 mg, 4 mg, Intravenous, Q6H PRN, Hall, Carole N, DO   sodium chloride HYPERTONIC 3 % nebulizer solution 4 mL, 4 mL, Nebulization, BID, Hall, Carole N, DO, 4 mL at 02/26/21 0903     Significant Hospital Events:  02/25/2021 - admit   Micro Data:   Results for orders placed or performed during the hospital encounter  of 02/25/21  Resp Panel by RT-PCR (Flu A&B, Covid) Nasopharyngeal Swab     Status: None   Collection Time: 02/25/21 11:15 AM   Specimen: Nasopharyngeal Swab; Nasopharyngeal(NP) swabs in vial transport medium  Result Value Ref Range Status   SARS Coronavirus 2 by RT PCR NEGATIVE NEGATIVE Final    Comment: (NOTE) SARS-CoV-2 target nucleic acids are NOT DETECTED.  The SARS-CoV-2 RNA is generally detectable in upper respiratory specimens during the acute phase of infection. The  lowest concentration of SARS-CoV-2 viral copies this assay can detect is 138 copies/mL. A negative result does not preclude SARS-Cov-2 infection and should not be used as the sole basis for treatment or other patient management decisions. A negative result may occur with  improper specimen collection/handling, submission of specimen other than nasopharyngeal swab, presence of viral mutation(s) within the areas targeted by this assay, and inadequate number of viral copies(<138 copies/mL). A negative result must be combined with clinical observations, patient history, and epidemiological information. The expected result is Negative.  Fact Sheet for Patients:  EntrepreneurPulse.com.au  Fact Sheet for Healthcare Providers:  IncredibleEmployment.be  This test is no t yet approved or cleared by the [place] FDA and  has been authorized for detection and/or diagnosis of SARS-CoV-2 by FDA under an Emergency Use Authorization (EUA). This EUA will remain  in effect (meaning this test can be used) for the duration of the COVID-19 declaration under Section 564(b)(1) of the Act, 21 U.S.C.section 360bbb-3(b)(1), unless the authorization is terminated  or revoked sooner.       Influenza A by PCR NEGATIVE NEGATIVE Final   Influenza B by PCR NEGATIVE NEGATIVE Final    Comment: (NOTE) The Xpert Xpress SARS-CoV-2/FLU/RSV plus assay is intended as an aid in the diagnosis of influenza from Nasopharyngeal swab specimens and should not be used as a sole basis for treatment. Nasal washings and aspirates are unacceptable for Xpert Xpress SARS-CoV-2/FLU/RSV testing.  Fact Sheet for Patients: EntrepreneurPulse.com.au  Fact Sheet for Healthcare Providers: IncredibleEmployment.be  This test is not yet approved or cleared by the [place] FDA and has been authorized for detection and/or diagnosis of SARS-CoV-2 by FDA under  an Emergency Use Authorization (EUA). This EUA will remain in effect (meaning this test can be used) for the duration of the COVID-19 declaration under Section 564(b)(1) of the Act, 21 U.S.C. section 360bbb-3(b)(1), unless the authorization is terminated or revoked.  Performed at [facility], [place] [address] [place], [place] [address]      Antimicrobials:   Anti-infectives (From admission, onward)    Start     Dose/Rate Route Frequency Ordered Stop   02/26/21 1000  azithromycin (ZITHROMAX) tablet 250 mg       See Hyperspace for full Linked Orders Report.   250 mg Oral Daily 02/25/21 1723 03/02/21 0959   02/26/21 0000  azithromycin (ZITHROMAX Z-PAK) 250 MG tablet           02/26/21 1215 03/03/21 2359   02/25/21 1800  cefTRIAXone (ROCEPHIN) 1 g in sodium chloride 0.9 % 100 mL IVPB        1 g 200 mL/hr over 30 Minutes Intravenous Every 24 hours 02/25/21 1723 02/28/21 1759   02/25/21 1800  azithromycin (ZITHROMAX) tablet 500 mg       See Hyperspace for full Linked Orders Report.   500 mg Oral Daily 02/25/21 1723 02/25/21 1855        Interim History / Subjective:   02/26/2021 - seen in bed  1405  Objective   Blood pressure (!) 145/62, pulse 73, temperature 97.8 F (36.6 C), resp. rate 20, height 5' 10.87" (1.8 m), weight 126.7 kg, SpO2 90 %.        Intake/Output Summary (Last 24 hours) at 02/26/2021 1540 Last data filed at 02/25/2021 2249 Gross per 24 hour  Intake 580 ml  Output --  Net 580 ml   Filed Weights   02/25/21 1045 02/25/21 2227  Weight: 127 kg 126.7 kg    Examination: General: obese. No distress HENT: oral cavity normal Lungs: Wheezing +. No distress. Speaks full sentences. No acce muscule use Cardiovascular: normal heart sound Abdomen: obese Extremities: intact Neuro: axox3. 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  Also severely low phosphorus  P:   Await rest of the respiratory virus panel multiplex Check urine drug screen Check procalcitonin Check urine strep and urine Legionella  Replete phosphorus [should not go home without repletion]  Check blood IgE, blood seasonal allergens 02/26/2021   Change nebs to dulera 200/4.,5 at 2 puff twice daily with albuterol mdi prn at dc Change solumedrol to prednisone 50mg  per day and do taper as follows  - Please take Take prednisone 50mg  daily x 3 days, 40mg  once daily x 3 days, then 30mg  once daily x 3 days, then 20mg  once daily x 3 days, then prednisone 10mg  once daily  x 3 days and stop      ELECTROLYTES A:  Severe hypophostaemia  02/26/2021  P: Replee K Phos   Best practice (daily eval):  Per [facility]   SIGNATURE    Dr. Brand Males, M.D., F.C.C.P,  Pulmonary and Critical Care Medicine Staff Physician, [place] Director - Interstitial Lung Disease  Program  [facility] [place], [place], [address]  NPI Number:  NPI [id number]  Pager: [phone], If no answer  -> Check AMION or Try [phone] Telephone (clinical office): [phone] Telephone (research): [phone]  3:40 PM 02/26/2021   02/26/2021 3:40 PM    LABS    PULMONARY No results for input(s): PHART, PCO2ART, PO2ART, HCO3, TCO2, O2SAT  in the last 168 hours.  Invalid input(s): PCO2, PO2  CBC Recent Labs  Lab 02/25/21 1118 02/26/21 0457  HGB 15.2 15.0  HCT 47.3 46.9  WBC 9.7 10.9*  PLT 223 249    COAGULATION No results for input(s): INR in the last 168 hours.  CARDIAC  No results for input(s): TROPONINI in the last 168 hours. No results for input(s): PROBNP in the last 168 hours.  CHEMISTRY Recent Labs  Lab 02/25/21 1118 02/26/21 0457  NA 135 138  K 4.0 4.3  CL 105 107  CO2 25 23  GLUCOSE 131* 140*  BUN 14 12  CREATININE 0.66 0.46*  CALCIUM 9.1 9.7  MG  --  2.3  PHOS  --  1.4*   Estimated Creatinine Clearance: 189.4 mL/min (A) (by C-G formula based on SCr of 0.46 mg/dL (L)).   LIVER No results for input(s): AST, ALT, ALKPHOS, BILITOT, PROT, ALBUMIN, INR in the last 168 hours.   INFECTIOUS No results for input(s): LATICACIDVEN, PROCALCITON in the last 168 hours.   ENDOCRINE CBG (last 3)  Recent Labs    02/26/21 0750 02/26/21 1146  GLUCAP 143* 147*         IMAGING x48h  - image(s) personally visualized  -   highlighted in bold DG Chest 2 View  Result Date: 02/25/2021 CLINICAL DATA:  26 year old male with history of dyspnea. EXAM: CHEST - 2 VIEW COMPARISON:  Chest x-ray 02/07/2021. FINDINGS: Lung volumes are normal. No consolidative airspace disease. No pleural effusions. No pneumothorax. No pulmonary nodule or mass noted. Pulmonary vasculature and the cardiomediastinal silhouette are within normal limits. IMPRESSION: No radiographic evidence of acute cardiopulmonary disease. Electronically Signed   By: Vinnie Langton M.D.   On: 02/25/2021 11:35   CT Angio Chest Pulmonary Embolism (PE) W or WO Contrast  Result Date: 02/26/2021 CLINICAL DATA:  26 year old male with history of shortness of breath and cough for the past 2 weeks. EXAM: CT ANGIOGRAPHY CHEST WITH CONTRAST TECHNIQUE: Multidetector CT imaging of the chest was performed using the standard protocol during bolus  administration of intravenous contrast. Multiplanar CT image reconstructions and MIPs were obtained to evaluate the vascular anatomy. RADIATION DOSE REDUCTION: This exam was performed according to the departmental dose-optimization program which includes automated exposure control, adjustment of the mA and/or kV according to patient size and/or use of iterative reconstruction technique. CONTRAST:  32mL OMNIPAQUE IOHEXOL 350 MG/ML SOLN COMPARISON:  No priors. FINDINGS: Cardiovascular: No filling defect in the central, lobar or segmental sized pulmonary arteries to suggest pulmonary embolism. Smaller subsegmental sized emboli can not be completely excluded secondary to extensive patient respiratory motion. Heart size is normal. There is no significant pericardial fluid, thickening or pericardial calcification. No atherosclerotic calcifications are noted in the thoracic aorta or the coronary arteries. Mediastinum/Nodes: No pathologically enlarged mediastinal or hilar lymph nodes. Esophagus is unremarkable in appearance. No axillary lymphadenopathy. Lungs/Pleura: Ground-glass attenuation in the right middle lobe and left lower lobe is nonspecific, but likely of infectious or inflammatory etiology. No acute consolidative airspace disease. No pleural effusions. No suspicious appearing pulmonary nodules or masses are noted. Upper Abdomen: Unremarkable. Musculoskeletal: There are no aggressive appearing lytic or blastic lesions noted in the visualized portions of the skeleton. Review of the MIP images confirms the above findings. IMPRESSION: 1. Although slightly limited by patient respiratory motion, there is no evidence of clinically significant central, lobar or segmental sized pulmonary embolism. 2. Patchy areas of ground-glass attenuation are noted in the right middle lobe and left lower lobe, likely of infectious or inflammatory etiology. Electronically Signed   By: Vinnie Langton M.D.   On: 02/26/2021 12:47

## 2021-02-26 NOTE — TOC Transition Note (Addendum)
Transition of Care [facility]) - CM/SW Discharge Note   Patient Details  Name: Cody Ward MRN: [id number] Date of Birth: [birth year]  Transition of Care [facility]) CM/SW Contact:  Darleene Cleaver, LCSW Phone Number: 02/26/2021, 11:45 AM   Clinical Narrative:     Patient will be going home.  Patient has a hospital follow up scheduled for February 1st at 9:30am at the  [facility] [address]  [place] [address] [phone].  CSW was informed that patient needs medication assistance, patient did qualify for the [facility] program in which his medications will be 3 dollars each.  Patient's medications were sent to [facility].  They will deliver the meds to the room before he leaves.    Final next level of care: Home/Self Care Barriers to Discharge: Barriers Resolved   Patient Goals and CMS Choice Patient states their goals for this hospitalization and ongoing recovery are:: To return back home.      Discharge Placement  Patient will be discharging back home.                     Discharge Plan and Services                                     Social Determinants of Health (SDOH) Interventions     Readmission Risk Interventions No flowsheet data found.

## 2021-02-26 NOTE — Discharge Summary (Addendum)
Discharge Summary  Cody Ward RRN:[id number] DOB: 05-28-1995  PCP: Pcp, No  Admit date: 02/25/2021 Discharge date: 02/26/2021  Time spent: 35 minutes.  Recommendations for Outpatient Follow-up:  Follow-up with pulmonary within a week. Take your medications as prescribed.  Discharge Diagnoses:  Active Hospital Problems   Diagnosis Date Noted   Asthma exacerbation 02/25/2021    Resolved Hospital Problems  No resolved problems to display.    Discharge Condition: Stable  Diet recommendation: Resume previous diet  Vitals:   02/26/21 1349 02/26/21 1413  BP:  (!) 145/62  Pulse: 82 73  Resp: 18 20  Temp:  97.8 F (36.6 C)  SpO2: 94% 90%    History of present illness:  Is Cody Ward is a 26 y.o. male with medical history significant for hypertension, GERD, asthma, tobacco use disorder, quit tobacco use and switched to vaping, obesity, who presented to [facility] ED with complaints of sudden onset shortness of breath similar but worse than previous asthma attacks.  Associated with a productive cough and chest congestion for weeks.  Reports having uncontrolled asthma for the past 2 years with frequent recurrent attacks.  Endorses use of vaping, started 2 years ago.  He is not aware of any particular triggers but states that when he lies down flat or when he is exposed to cold air his asthma tends to flare up.  He has a rescue inhaler.  Does not follow with pulmonary.  He presented to the ED for further evaluation and management of his symptomatology.  Upon presentation to the ED, O2 saturation in the mid 80s on room air.  Not on oxygen supplementation at baseline.  Patient placed on 2 L to maintain O2 saturation greater than 90%.  TRH, hospitalist team, called to admit.   Symptomatology improved.  States he feels a lot better.  02/26/2021: Patient was seen and examined at his bedside.  He has no new complaints.  He wants to go home due to his work obligations.  Hospital Course:   Principal Problem:   Asthma exacerbation  Improved acute asthma exacerbation with concern for chronic bronchitis Suspect triggered by vaping Received IV steroids, bronchodilators. Received pulmonary toilet.   Received Z-Pak, Rocephin x2. Follow-up with pulmonary   Improved Acute hypoxemic respiratory failure secondary to above Not on oxygen supplementation at baseline. 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 Follow-up with your primary care provider   Obesity BMI 39 Recommend weight loss outpatient with regular physical activity and healthy dieting.   Hyperglycemia likely exacerbated by IV steroids Hemoglobin A1c 5.8% on 03/01/2021.  Hypophosphatemia Serum phosphorus 1.4 Repleted intravenously and orally        Code Status: Full code      Consults called: Pulmonary on 02/24/2021.     Discharge Exam: BP (!) 145/62    Pulse 73    Temp 97.8 F (36.6 C)    Resp 20    Ht 5' 10.87" (1.8 m)    Wt 126.7 kg    SpO2 90%    BMI 39.11 kg/m  General: 26 y.o. year-old male well developed well nourished in no acute distress.  Alert and oriented x3. Cardiovascular: Regular rate and rhythm with no rubs or gallops.  No  thyromegaly or JVD noted.   Respiratory: Mild diffuse wheezing bilaterally. Good inspiratory effort. Abdomen: Soft nontender nondistended with normal bowel sounds x4 quadrants. Musculoskeletal: No lower extremity edema. Skin: No ulcerative lesions noted or rashes, Psychiatry: Mood is appropriate for condition and setting  Discharge Instructions You were cared for by a hospitalist during your hospital stay. If you have any questions about your discharge medications or the care you received  while you were in the hospital after you are discharged, you can call the unit and asked to speak with the hospitalist on call if the hospitalist that took care of you is not available. Once you are discharged, your primary care physician will handle any further medical issues. Please note that NO REFILLS for any discharge medications will be authorized once you are discharged, as it is imperative that you return to your primary care physician (or establish a relationship with a primary care physician if you do not have one) for your aftercare needs so that they can reassess your need for medications and monitor your lab values.   Allergies as of 02/26/2021   No Known Allergies      Medication List     TAKE these medications    albuterol (2.5 MG/3ML) 0.083% nebulizer solution Commonly known as: PROVENTIL Take 10 mg/hr by nebulization continuous. What changed: You were already taking a medication with the same name, and this prescription was added. Make sure you understand how and when to take each.   albuterol 108 (90 Base) MCG/ACT inhaler Commonly known as: VENTOLIN HFA Inhale 2 puffs into the lungs every 6 (six) hours as needed for wheezing or shortness of breath. What changed: Another medication with the same name was added. Make sure you understand how and when to take each.   azithromycin 250 MG tablet Commonly known as: Zithromax Z-Pak Take 2 tablets (500 mg) on  Day 1,  followed by 1 tablet (250 mg) once daily on Days 2 through 5.   benzonatate 100 MG capsule Commonly known as: Tessalon Perles Take 1 capsule (100 mg total) by mouth 3 (three) times daily as needed for cough.   magnesium 30 MG tablet Take 30 mg by mouth every evening.   potassium & sodium phosphates 280-160-250 MG Pack Commonly known as: PHOS-NAK Take 2 packets by mouth 4 (four) times daily -  before meals and at bedtime for 3 days.   riboflavin 100 MG Tabs tablet Commonly known as: VITAMIN B-2 Take 100 mg  by mouth every evening.               Durable Medical Equipment  (From admission, onward)           Start     Ordered   02/26/21 1216  For home use only DME Nebulizer machine  Once       Question Answer Comment  Patient needs a nebulizer to treat with the following condition Asthma exacerbation   Length of Need 6 Months      02/26/21 1216           No Known Allergies  Follow-up Information     [facility]. Call today.   Why: Please call for a posthospital follow-up appointment. Contact information: [address] [place] [phone]                 The results of significant diagnostics from this hospitalization (including imaging, microbiology, ancillary and laboratory) are listed below  for reference.    Significant Diagnostic Studies: DG Chest 2 View  Result Date: 02/25/2021 CLINICAL DATA:  26 year old male with history of dyspnea. EXAM: CHEST - 2 VIEW COMPARISON:  Chest x-ray 02/07/2021. FINDINGS: Lung volumes are normal. No consolidative airspace disease. No pleural effusions. No pneumothorax. No pulmonary nodule or mass noted. Pulmonary vasculature and the cardiomediastinal silhouette are within normal limits. IMPRESSION: No radiographic evidence of acute cardiopulmonary disease. Electronically Signed   By: Trudie Reed M.D.   On: 02/25/2021 11:35   CT Angio Chest Pulmonary Embolism (PE) W or WO Contrast  Result Date: 02/26/2021 CLINICAL DATA:  26 year old male with history of shortness of breath and cough for the past 2 weeks. EXAM: CT ANGIOGRAPHY CHEST WITH CONTRAST TECHNIQUE: Multidetector CT imaging of the chest was performed using the standard protocol during bolus administration of intravenous contrast. Multiplanar CT image reconstructions and MIPs were obtained to evaluate the vascular anatomy. RADIATION DOSE REDUCTION: This exam was performed according to the departmental  dose-optimization program which includes automated exposure control, adjustment of the mA and/or kV according to patient size and/or use of iterative reconstruction technique. CONTRAST:  60mL OMNIPAQUE IOHEXOL 350 MG/ML SOLN COMPARISON:  No priors. FINDINGS: Cardiovascular: No filling defect in the central, lobar or segmental sized pulmonary arteries to suggest pulmonary embolism. Smaller subsegmental sized emboli can not be completely excluded secondary to extensive patient respiratory motion. Heart size is normal. There is no significant pericardial fluid, thickening or pericardial calcification. No atherosclerotic calcifications are noted in the thoracic aorta or the coronary arteries. Mediastinum/Nodes: No pathologically enlarged mediastinal or hilar lymph nodes. Esophagus is unremarkable in appearance. No axillary lymphadenopathy. Lungs/Pleura: Ground-glass attenuation in the right middle lobe and left lower lobe is nonspecific, but likely of infectious or inflammatory etiology. No acute consolidative airspace disease. No pleural effusions. No suspicious appearing pulmonary nodules or masses are noted. Upper Abdomen: Unremarkable. Musculoskeletal: There are no aggressive appearing lytic or blastic lesions noted in the visualized portions of the skeleton. Review of the MIP images confirms the above findings. IMPRESSION: 1. Although slightly limited by patient respiratory motion, there is no evidence of clinically significant central, lobar or segmental sized pulmonary embolism. 2. Patchy areas of ground-glass attenuation are noted in the right middle lobe and left lower lobe, likely of infectious or inflammatory etiology. Electronically Signed   By: Trudie Reed M.D.   On: 02/26/2021 12:47    Microbiology: Recent Results (from the past 240 hour(s))  Resp Panel by RT-PCR (Flu A&B, Covid) Nasopharyngeal Swab     Status: None   Collection Time: 02/25/21 11:15 AM   Specimen: Nasopharyngeal Swab;  Nasopharyngeal(NP) swabs in vial transport medium  Result Value Ref Range Status   SARS Coronavirus 2 by RT PCR NEGATIVE NEGATIVE Final    Comment: (NOTE) SARS-CoV-2 target nucleic acids are NOT DETECTED.  The SARS-CoV-2 RNA is generally detectable in upper respiratory specimens during the acute phase of infection. The lowest concentration of SARS-CoV-2 viral copies this assay can detect is 138 copies/mL. A negative result does not preclude SARS-Cov-2 infection and should not be used as the sole basis for treatment or other patient management decisions. A negative result may occur with  improper specimen collection/handling, submission of specimen other than nasopharyngeal swab, presence of viral mutation(s) within the areas targeted by this assay, and inadequate number of viral copies(<138 copies/mL). A negative result must be combined with clinical observations, patient history, and epidemiological information. The expected result is Negative.  Fact Sheet for Patients:  BloggerCourse.comhttps://www.fda.gov/media/152166/download  Fact Sheet for Healthcare Providers:  SeriousBroker.ithttps://www.fda.gov/media/152162/download  This test is no t yet approved or cleared by the [place] FDA and  has been authorized for detection and/or diagnosis of SARS-CoV-2 by FDA under an Emergency Use Authorization (EUA). This EUA will remain  in effect (meaning this test can be used) for the duration of the COVID-19 declaration under Section 564(b)(1) of the Act, 21 U.S.C.section 360bbb-3(b)(1), unless the authorization is terminated  or revoked sooner.       Influenza A by PCR NEGATIVE NEGATIVE Final   Influenza B by PCR NEGATIVE NEGATIVE Final    Comment: (NOTE) The Xpert Xpress SARS-CoV-2/FLU/RSV plus assay is intended as an aid in the diagnosis of influenza from Nasopharyngeal swab specimens and should not be used as a sole basis for treatment. Nasal washings and aspirates are unacceptable for Xpert Xpress  SARS-CoV-2/FLU/RSV testing.  Fact Sheet for Patients: BloggerCourse.comhttps://www.fda.gov/media/152166/download  Fact Sheet for Healthcare Providers: SeriousBroker.ithttps://www.fda.gov/media/152162/download  This test is not yet approved or cleared by the [place] FDA and has been authorized for detection and/or diagnosis of SARS-CoV-2 by FDA under an Emergency Use Authorization (EUA). This EUA will remain in effect (meaning this test can be used) for the duration of the COVID-19 declaration under Section 564(b)(1) of the Act, 21 U.S.C. section 360bbb-3(b)(1), unless the authorization is terminated or revoked.  Performed at [facility], [address] [place], [place] [id number]      Labs: Basic Metabolic Panel: Recent Labs  Lab 02/25/21 1118 02/26/21 0457  NA 135 138  K 4.0 4.3  CL 105 107  CO2 25 23  GLUCOSE 131* 140*  BUN 14 12  CREATININE 0.66 0.46*  CALCIUM 9.1 9.7  MG  --  2.3  PHOS  --  1.4*   Liver Function Tests: No results for input(s): AST, ALT, ALKPHOS, BILITOT, PROT, ALBUMIN in the last 168 hours. No results for input(s): LIPASE, AMYLASE in the last 168 hours. No results for input(s): AMMONIA in the last 168 hours. CBC: Recent Labs  Lab 02/25/21 1118 02/26/21 0457  WBC 9.7 10.9*  NEUTROABS 5.5  --   HGB 15.2 15.0  HCT 47.3 46.9  MCV 84.6 84.4  PLT 223 249   Cardiac Enzymes: No results for input(s): CKTOTAL, CKMB, CKMBINDEX, TROPONINI in the last 168 hours. BNP: BNP (last 3 results) No results for input(s): BNP in the last 8760 hours.  ProBNP (last 3 results) No results for input(s): PROBNP in the last 8760 hours.  CBG: Recent Labs  Lab 02/26/21 0750 02/26/21 1146  GLUCAP 143* 147*       Signed:  Darlin Droparole N Doralee Kocak, MD [facility] 02/26/2021, 4:04 PM

## 2021-02-26 NOTE — Progress Notes (Signed)
Pt had five runs of Vtach. Face-to-face encounter with Margo Aye. Carole N. DO. Pt asymptomatic.

## 2021-02-26 NOTE — Telephone Encounter (Signed)
Cody Ward at [facility] - September 17, 1995 [address] [place] [address] - please give hospital follow-up 30 min this week for asthma and then with Dr Lenice Llamas

## 2021-02-26 NOTE — Progress Notes (Signed)
SATURATION QUALIFICATIONS: (This note is used to comply with regulatory documentation for home oxygen)  Patient Saturations on Room Air at Rest = 95%  Patient Saturations on Room Air while Ambulating = 93%  Patient Saturations on 0 Liters of oxygen while Ambulating = 0%  Please briefly explain why patient needs home oxygen: Pt not qualified for home oxygen. Pt maintained O2 levels 93-95%.

## 2021-02-27 ENCOUNTER — Other Ambulatory Visit (HOSPITAL_COMMUNITY): Payer: Self-pay

## 2021-02-27 LAB — MAGNESIUM: Magnesium: 2 mg/dL (ref 1.7–2.4)

## 2021-02-27 LAB — CBC WITH DIFFERENTIAL/PLATELET
Abs Immature Granulocytes: 0.12 10*3/uL — ABNORMAL HIGH (ref 0.00–0.07)
Basophils Absolute: 0 10*3/uL (ref 0.0–0.1)
Basophils Relative: 0 %
Eosinophils Absolute: 0 10*3/uL (ref 0.0–0.5)
Eosinophils Relative: 0 %
HCT: 43.8 % (ref 39.0–52.0)
Hemoglobin: 14.2 g/dL (ref 13.0–17.0)
Immature Granulocytes: 1 %
Lymphocytes Relative: 7 %
Lymphs Abs: 1.1 10*3/uL (ref 0.7–4.0)
MCH: 27.3 pg (ref 26.0–34.0)
MCHC: 32.4 g/dL (ref 30.0–36.0)
MCV: 84.2 fL (ref 80.0–100.0)
Monocytes Absolute: 1.4 10*3/uL — ABNORMAL HIGH (ref 0.1–1.0)
Monocytes Relative: 9 %
Neutro Abs: 13.6 10*3/uL — ABNORMAL HIGH (ref 1.7–7.7)
Neutrophils Relative %: 83 %
Platelets: 214 10*3/uL (ref 150–400)
RBC: 5.2 MIL/uL (ref 4.22–5.81)
RDW: 13.7 % (ref 11.5–15.5)
WBC: 16.2 10*3/uL — ABNORMAL HIGH (ref 4.0–10.5)
nRBC: 0 % (ref 0.0–0.2)

## 2021-02-27 LAB — PROCALCITONIN: Procalcitonin: <0.1 ng/mL

## 2021-02-27 LAB — COMPREHENSIVE METABOLIC PANEL
ALT: 21 U/L (ref 0–44)
AST: 17 U/L (ref 15–41)
Albumin: 3.8 g/dL (ref 3.5–5.0)
Alkaline Phosphatase: 42 U/L (ref 38–126)
Anion gap: 8 (ref 5–15)
BUN: 17 mg/dL (ref 6–20)
CO2: 25 mmol/L (ref 22–32)
Calcium: 9 mg/dL (ref 8.9–10.3)
Chloride: 105 mmol/L (ref 98–111)
Creatinine, Ser: 0.69 mg/dL (ref 0.61–1.24)
GFR, Estimated: >60 mL/min (ref >60)
Glucose, Bld: 114 mg/dL — ABNORMAL HIGH (ref 70–99)
Potassium: 4 mmol/L (ref 3.5–5.1)
Sodium: 138 mmol/L (ref 135–145)
Total Bilirubin: 0.5 mg/dL (ref 0.3–1.2)
Total Protein: 6.3 g/dL — ABNORMAL LOW (ref 6.5–8.1)

## 2021-02-27 LAB — PHOSPHORUS: Phosphorus: 4.7 mg/dL — ABNORMAL HIGH (ref 2.5–4.6)

## 2021-02-27 LAB — LACTIC ACID, PLASMA: Lactic Acid, Venous: 1.9 mmol/L (ref 0.5–1.9)

## 2021-02-27 LAB — GLUCOSE, CAPILLARY: Glucose-Capillary: 100 mg/dL — ABNORMAL HIGH (ref 70–99)

## 2021-02-27 MED ORDER — ALBUTEROL SULFATE (2.5 MG/3ML) 0.083% IN NEBU
2.5000 mg | INHALATION_SOLUTION | Freq: Four times a day (QID) | RESPIRATORY_TRACT | 0 refills | Status: DC | PRN
  Filled 2021-02-27: qty 90, 8d supply, fill #0

## 2021-02-27 MED ORDER — PREDNISONE 10 MG PO TABS
ORAL_TABLET | ORAL | 0 refills | Status: DC
  Filled 2021-02-27: qty 45, 15d supply, fill #0

## 2021-02-27 MED ORDER — PREDNISONE 10 MG PO TABS
ORAL_TABLET | ORAL | 0 refills | Status: DC
Start: 2021-02-27 — End: 2021-02-27
  Filled 2021-02-27: qty 30, fill #0

## 2021-02-27 MED ORDER — IPRATROPIUM-ALBUTEROL 0.5-2.5 (3) MG/3ML IN SOLN
3.0000 mL | RESPIRATORY_TRACT | Status: DC | PRN
  Administered 2021-02-27: 3 mL via RESPIRATORY_TRACT

## 2021-02-27 MED ORDER — METHYLPREDNISOLONE SODIUM SUCC 125 MG IJ SOLR
125.0000 mg | Freq: Once | INTRAMUSCULAR | Status: AC
  Administered 2021-02-27: 125 mg via INTRAVENOUS
  Filled 2021-02-27: qty 2

## 2021-02-27 NOTE — Discharge Summary (Addendum)
Discharge Summary  Cody Ward [id number] DOB: Oct 04, 1995  PCP: Pcp, No  Admit date: 02/25/2021 Discharge date: 02/27/2021  Time spent: 25 minutes.  Recommendations for Outpatient Follow-up:  Follow-up with pulmonary. Follow up with your primary care provider. Take your medications as prescribed. Continue to completely abstain from vaping.  Discharge Diagnoses:  Active Hospital Problems   Diagnosis Date Noted   Asthma exacerbation 02/25/2021    Resolved Hospital Problems  No resolved problems to display.    Discharge Condition: Stable  Diet recommendation: Resume previous diet.  Vitals:   02/27/21 0638 02/27/21 0758  BP: (!) 143/69   Pulse: 70   Resp: 19   Temp: 97.9 F (36.6 C)   SpO2: 97% 96%    History of present illness:   Is Cody Ward is a 26 y.o. male with medical history significant for hypertension, GERD, asthma, tobacco use disorder, quit tobacco use and switched to vaping, obesity, who presented to [facility] ED with complaints of sudden onset shortness of breath similar but worse than previous asthma attacks.  Associated with a productive cough and chest congestion for weeks.  Reports having uncontrolled asthma for the past 2 years with frequent recurrent attacks.  Endorses use of vaping, started 2 years ago.  He is not aware of any particular triggers but states that when he lies down flat or when he is exposed to cold air his asthma tends to flare up.  He has a rescue inhaler.  Does not follow with pulmonary.  He presented to the ED for further evaluation and management of his symptomatology.  Upon presentation to the ED, O2 saturation in the mid 80s on room air.  Not on oxygen supplementation at baseline.  Patient placed on 2 L to maintain O2 saturation greater than 90%.  TRH, hospitalist team, called to admit.   Symptomatology improved.  States he feels a lot better.  02/27/2021: Patient was seen and examined at his bedside.  There were no acute events  overnight.  He has no complaints.  He is eager to go home.  Hospital Course:  Principal Problem:   Asthma exacerbation  Resolved acute asthma exacerbation with concern for chronic bronchitis Suspect triggered by vaping Received IV steroids, bronchodilators. Received pulmonary toilet.   Received Z-Pak, Rocephin x2. Follow-up with pulmonary   Resolved acute hypoxemic respiratory failure secondary to above Not on oxygen supplementation at baseline. 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   Essential hypertension BP is at goal now that he is back on home Norvasc 5 mg daily. Follow-up with your primary care provider   Obesity BMI 39 Recommend weight loss outpatient with regular physical activity and healthy dieting.   Prediabetes with hyperglycemia likely exacerbated by IV steroids Hemoglobin A1c 5.8% on 03/01/2021.   Resolved post repletion: Hypophosphatemia Serum phosphorus 1.4> 4.7         Code Status: Full code       Consults called: Pulmonary on 02/24/2021.        Discharge Exam: BP (!) 143/69 (BP Location: Left Arm)    Pulse 70    Temp 97.9 F (36.6 C) (Oral)    Resp 19    Ht 5' 10.87" (1.8 m)    Wt 126.7 kg    SpO2 96%    BMI 39.11 kg/m  General: 25  y.o. year-old male well developed well nourished in no acute distress.  Alert and oriented x3. Cardiovascular: Regular rate and rhythm with no rubs or gallops.  No thyromegaly or JVD noted.   Respiratory: Clear to auscultation with no wheezes or rales. Good inspiratory effort. Abdomen: Soft nontender nondistended with normal bowel sounds x4 quadrants. Musculoskeletal: No lower extremity edema. 2/4 pulses in all 4 extremities. Skin: No ulcerative lesions noted or rashes, Psychiatry: Mood is appropriate for condition and  setting  Discharge Instructions You were cared for by a hospitalist during your hospital stay. If you have any questions about your discharge medications or the care you received while you were in the hospital after you are discharged, you can call the unit and asked to speak with the hospitalist on call if the hospitalist that took care of you is not available. Once you are discharged, your primary care physician will handle any further medical issues. Please note that NO REFILLS for any discharge medications will be authorized once you are discharged, as it is imperative that you return to your primary care physician (or establish a relationship with a primary care physician if you do not have one) for your aftercare needs so that they can reassess your need for medications and monitor your lab values.   Allergies as of 02/27/2021   No Known Allergies      Medication List     TAKE these medications    albuterol 108 (90 Base) MCG/ACT inhaler Commonly known as: VENTOLIN HFA Inhale 2 puffs into the lungs every 6 (six) hours as needed for wheezing or shortness of breath. What changed: Another medication with the same name was added. Make sure you understand how and when to take each.   albuterol (2.5 MG/3ML) 0.083% nebulizer solution Commonly known as: PROVENTIL Take 3 mLs (2.5 mg total) by nebulization every 6 (six) hours as needed for wheezing or shortness of breath. What changed: You were already taking a medication with the same name, and this prescription was added. Make sure you understand how and when to take each.   azithromycin 250 MG tablet Commonly known as: ZITHROMAX Take 1 tablet (250 mg total) by mouth daily for 3 days   benzonatate 100 MG capsule Commonly known as: Tessalon Perles Take 1 capsule (100 mg total) by mouth 3 (three) times daily as needed for cough.   magnesium 30 MG tablet Take 30 mg by mouth every evening.   mometasone-formoterol 200-5 MCG/ACT  Aero Commonly known as: DULERA Inhale 2 puffs into the lungs 2 (two) times daily.   predniSONE 10 MG tablet Commonly known as: DELTASONE Take 5 tablets by mouth daily x3 days, 4 tablets daily x3 days, 3 tablets daily x3 days, 2 tablets daily x3 days then 1 tablet daily x3 days, then stop.   riboflavin 100 MG Tabs tablet Commonly known as: VITAMIN B-2 Take 100 mg by mouth every evening.               Durable Medical Equipment  (From admission, onward)           Start     Ordered   02/26/21 1216  For home use only DME Nebulizer machine  Once       Question Answer Comment  Patient needs a nebulizer to treat with the following condition Asthma exacerbation   Length of Need 6 Months      02/26/21 1216           No Known Allergies  The results of significant diagnostics from this hospitalization (including imaging, microbiology, ancillary and laboratory) are listed below for reference.    Significant Diagnostic Studies: DG Chest 2 View  Result Date: 02/25/2021 CLINICAL DATA:  26 year old male with history of dyspnea. EXAM: CHEST - 2 VIEW COMPARISON:  Chest x-ray 02/07/2021. FINDINGS: Lung volumes are normal. No consolidative airspace disease. No pleural effusions. No pneumothorax. No pulmonary nodule or mass noted. Pulmonary vasculature and the cardiomediastinal silhouette are within normal limits. IMPRESSION: No radiographic evidence of acute cardiopulmonary disease. Electronically Signed   By: Vinnie Langton M.D.   On: 02/25/2021 11:35   CT Angio Chest Pulmonary Embolism (PE) W or WO Contrast  Result Date: 02/26/2021 CLINICAL DATA:  26 year old male with history of shortness of breath and cough for the past 2 weeks. EXAM: CT ANGIOGRAPHY CHEST WITH CONTRAST TECHNIQUE: Multidetector CT imaging of the chest was performed using the standard protocol during bolus administration of intravenous contrast. Multiplanar CT image reconstructions and MIPs were obtained to  evaluate the vascular anatomy. RADIATION DOSE REDUCTION: This exam was performed according to the departmental dose-optimization program which includes automated exposure control, adjustment of the mA and/or kV according to patient size and/or use of iterative reconstruction technique. CONTRAST:  29mL OMNIPAQUE IOHEXOL 350 MG/ML SOLN COMPARISON:  No priors. FINDINGS: Cardiovascular: No filling defect in the central, lobar or segmental sized pulmonary arteries to suggest pulmonary embolism. Smaller subsegmental sized emboli can not be completely excluded secondary to extensive patient respiratory motion. Heart size is normal. There is no significant pericardial fluid, thickening or pericardial calcification. No atherosclerotic calcifications are noted in the thoracic aorta or the coronary arteries. Mediastinum/Nodes: No pathologically enlarged mediastinal or hilar lymph nodes. Esophagus is unremarkable in appearance. No axillary lymphadenopathy. Lungs/Pleura: Ground-glass attenuation in the right middle lobe and left lower lobe is nonspecific, but likely of infectious or inflammatory etiology. No acute consolidative airspace disease. No pleural effusions. No suspicious appearing pulmonary nodules or masses are noted. Upper Abdomen: Unremarkable. Musculoskeletal: There are no aggressive appearing lytic or blastic lesions noted in the visualized portions of the skeleton. Review of the MIP images confirms the above findings. IMPRESSION: 1. Although slightly limited by patient respiratory motion, there is no evidence of clinically significant central, lobar or segmental sized pulmonary embolism. 2. Patchy areas of ground-glass attenuation are noted in the right middle lobe and left lower lobe, likely of infectious or inflammatory etiology. Electronically Signed   By: Vinnie Langton M.D.   On: 02/26/2021 12:47    Microbiology: Recent Results (from the past 240 hour(s))  Resp Panel by RT-PCR (Flu A&B, Covid)  Nasopharyngeal Swab     Status: None   Collection Time: 02/25/21 11:15 AM   Specimen: Nasopharyngeal Swab; Nasopharyngeal(NP) swabs in vial transport medium  Result Value Ref Range Status   SARS Coronavirus 2 by RT PCR NEGATIVE NEGATIVE Final    Comment: (NOTE) SARS-CoV-2 target nucleic acids are NOT DETECTED.  The SARS-CoV-2 RNA is generally detectable in upper respiratory specimens during the acute phase of infection. The lowest concentration of SARS-CoV-2 viral copies this assay can detect is 138 copies/mL. A negative result does not preclude SARS-Cov-2 infection and should not be used as the sole basis for treatment or other patient management decisions. A negative result may occur with  improper specimen collection/handling, submission of specimen other than nasopharyngeal swab, presence of viral mutation(s) within the areas targeted by this assay, and inadequate number of viral copies(<138 copies/mL). A negative result must be combined with  clinical observations, patient history, and epidemiological information. The expected result is Negative.  Fact Sheet for Patients:  EntrepreneurPulse.com.au  Fact Sheet for Healthcare Providers:  IncredibleEmployment.be  This test is no t yet approved or cleared by the [place] FDA and  has been authorized for detection and/or diagnosis of SARS-CoV-2 by FDA under an Emergency Use Authorization (EUA). This EUA will remain  in effect (meaning this test can be used) for the duration of the COVID-19 declaration under Section 564(b)(1) of the Act, 21 U.S.C.section 360bbb-3(b)(1), unless the authorization is terminated  or revoked sooner.       Influenza A by PCR NEGATIVE NEGATIVE Final   Influenza B by PCR NEGATIVE NEGATIVE Final    Comment: (NOTE) The Xpert Xpress SARS-CoV-2/FLU/RSV plus assay is intended as an aid in the diagnosis of influenza from Nasopharyngeal swab specimens and should not be  used as a sole basis for treatment. Nasal washings and aspirates are unacceptable for Xpert Xpress SARS-CoV-2/FLU/RSV testing.  Fact Sheet for Patients: EntrepreneurPulse.com.au  Fact Sheet for Healthcare Providers: IncredibleEmployment.be  This test is not yet approved or cleared by the [place] FDA and has been authorized for detection and/or diagnosis of SARS-CoV-2 by FDA under an Emergency Use Authorization (EUA). This EUA will remain in effect (meaning this test can be used) for the duration of the COVID-19 declaration under Section 564(b)(1) of the Act, 21 U.S.C. section 360bbb-3(b)(1), unless the authorization is terminated or revoked.  Performed at [facility], [place] [address], [place],  [address]      Labs: Basic Metabolic Panel: Recent Labs  Lab 02/25/21 1118 02/26/21 0457 02/27/21 0544  NA 135 138 138  K 4.0 4.3 4.0  CL 105 107 105  CO2 25 23 25   GLUCOSE 131* 140* 114*  BUN 14 12 17   CREATININE 0.66 0.46* 0.69  CALCIUM 9.1 9.7 9.0  MG  --  2.3 2.0  PHOS  --  1.4* 4.7*   Liver Function Tests: Recent Labs  Lab 02/26/21 1608 02/27/21 0544  AST 22 17  ALT 23 21  ALKPHOS 52 42  BILITOT 0.4 0.5  PROT 7.4 6.3*  ALBUMIN 4.3 3.8   No results for input(s): LIPASE, AMYLASE in the last 168 hours. No results for input(s): AMMONIA in the last 168 hours. CBC: Recent Labs  Lab 02/25/21 1118 02/26/21 0457 02/27/21 0544  WBC 9.7 10.9* 16.2*  NEUTROABS 5.5  --  13.6*  HGB 15.2 15.0 14.2  HCT 47.3 46.9 43.8  MCV 84.6 84.4 84.2  PLT 223 249 214   Cardiac Enzymes: No results for input(s): CKTOTAL, CKMB, CKMBINDEX, TROPONINI in the last 168 hours. BNP: BNP (last 3 results) No results for input(s): BNP in the last 8760 hours.  ProBNP (last 3 results) No results for input(s): PROBNP in the last 8760 hours.  CBG: Recent Labs  Lab 02/26/21 0750 02/26/21 1146 02/26/21 1723 02/26/21 2155  02/27/21 0737  GLUCAP 143* 147* 138* 152* 100*       Signed:  Kayleen Memos, MD [facility] 02/27/2021, 3:06 PM

## 2021-02-27 NOTE — Progress Notes (Signed)
Discharge instructions given and explained to patient, he verbalized understanding, patient denies any pain/distress. Patient waiting for home med arrangement from outpatient pharmacy.

## 2021-02-27 NOTE — Telephone Encounter (Signed)
Patient is scheduled for HFU with Rhunette Croft, NP 03/02/2021 at 12pm and scheduled with Dr. Celine Mans on 03/15/2021 at 330pm.

## 2021-02-28 ENCOUNTER — Other Ambulatory Visit (HOSPITAL_COMMUNITY): Payer: Self-pay

## 2021-02-28 NOTE — Telephone Encounter (Signed)
Thanks. Will close message °

## 2021-03-01 ENCOUNTER — Other Ambulatory Visit (HOSPITAL_COMMUNITY): Payer: Self-pay

## 2021-03-02 ENCOUNTER — Encounter: Payer: Self-pay | Admitting: Nurse Practitioner

## 2021-03-02 ENCOUNTER — Other Ambulatory Visit: Payer: Self-pay

## 2021-03-02 ENCOUNTER — Ambulatory Visit (INDEPENDENT_AMBULATORY_CARE_PROVIDER_SITE_OTHER): Payer: Self-pay | Admitting: Nurse Practitioner

## 2021-03-02 DIAGNOSIS — J4550 Severe persistent asthma, uncomplicated: Secondary | ICD-10-CM

## 2021-03-02 LAB — IGE: IgE (Immunoglobulin E), Serum: 376 IU/mL (ref 6–495)

## 2021-03-02 MED ORDER — MONTELUKAST SODIUM 10 MG PO TABS
10.0000 mg | ORAL_TABLET | Freq: Every day | ORAL | 3 refills | Status: DC
Start: 2021-03-02 — End: 2021-05-02

## 2021-03-02 NOTE — Patient Instructions (Addendum)
-  Continue Albuterol inhaler 2 puffs or 3 mL neb every 6 hours as needed for shortness of breath or wheezing. Notify if symptoms persist despite rescue inhaler/neb use. -Continue Dulera 2 puffs Twice daily. Brush tongue and rinse mouth afterwards  -Complete prednisone taper pack. Take in AM with food. Notify if you notice worsening in your breathing upon decreased dose or once you finish the prednisone.  -Continue mucinex 600 mg Twice daily  -Continue flutter valve  Start singulair 10 mg At bedtime   Asthma Action Plan in place Rinse mouth after inhaled corticosteroid use.  Avoid triggers, when able.  Exercise encouraged. Notify if worsening symptoms upon exertion.  Notify and seek help if symptoms unrelieved by rescue inhaler.  We are still awaiting your allergen panel results. We will notify you of these once they come back!  Follow up in one month with Dr. Marchelle Gearing. If symptoms do not improve or worsen, please contact office for sooner follow up or seek emergency care.

## 2021-03-02 NOTE — Progress Notes (Signed)
@Patient  ID: Cody Ward, male    DOB: 08-19-1995, 26 y.o.   MRN: [id number]  Chief Complaint  Patient presents with   Follow-up    Patient says he's doing better now. Breathing has gotten better.     Referring provider: No ref. provider found  HPI: 26 year old male, never smoker followed for severe persistent asthma.  He is new to the pulmonary clinic and was last seen by Dr. Chase Caller during his most recent admission for asthma exacerbation.  Past medical history significant for obesity and GERD.  TEST/EVENTS:  02/25/2021 CXR 2 view: No acute cardiopulmonary disease.  Lungs clear. 02/26/2021 CTA chest: Groundglass attenuation in the right middle lobe and left lower lobe, likely infectious or inflammatory.  No evidence of PE  02/25/2021-02/27/2021: Hospital admission for asthma exacerbation.  PCCM consulted.  Required 2 L of oxygen during admission.  Flu and COVID-negative.  Treated with antibiotic therapy and steroids.  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  Allergen panel ordered.  Advised to discharge on Dulera 200 2 puffs twice a day.  Discharged on prednisone taper.  03/02/2021: Today - hospital f/u Patient presents today for hospital follow up. He was discharged on 1/16 after severe asthma exacerbation and noted to have eosinophilia up to 2000. Prior to admission, he was only using albuterol. He was discharged on [facility] Twice daily. Today, he reports significant improvement in his breathing. He still has an occasional cough which usually occurs with his breathing treatments. It is productive with small amount of clear sputum. His cough is improving. He denies any significant wheezing, orthopnea, PND, chest pain, or lower extremity edema. He continues on [place] Twice daily and is using his breathing treatments twice a day, as directed upon discharge. Otherwise, he has not required his rescue inhaler. Currently awaiting allergen panel results form hospital  stay. He is on his prednisone taper for 15 days. Overall, he feels well and offers no further complaints.   Allergies  Allergen Reactions   Grass Pollen(K-O-R-T-Swt Vern) Cough    Rash and cough    Immunization History  Administered Date(s) Administered   Influenza Split 01/13/2013    Past Medical History:  Diagnosis Date   Asthma     Tobacco History: Social History   Tobacco Use  Smoking Status Never   Passive exposure: Never  Smokeless Tobacco Never   Counseling given: Not Answered   Outpatient Medications Prior to Visit  Medication Sig Dispense Refill   albuterol (PROVENTIL) (2.5 MG/3ML) 0.083% nebulizer solution Take 3 mLs (2.5 mg total) by nebulization every 6 (six) hours as needed for wheezing or shortness of breath. 90 mL 0   albuterol (VENTOLIN HFA) 108 (90 Base) MCG/ACT inhaler Inhale 2 puffs into the lungs every 6 (six) hours as needed for wheezing or shortness of breath. 8.5 g 0   azithromycin (ZITHROMAX) 250 MG tablet Take 1 tablet (250 mg total) by mouth daily for 3 days 6 each 0   magnesium 30 MG tablet Take 30 mg by mouth every evening.     mometasone-formoterol (DULERA) 200-5 MCG/ACT AERO Inhale 2 puffs into the lungs 2 (two) times daily. 13 g 0   riboflavin (VITAMIN B-2) 100 MG TABS tablet Take 100 mg by mouth every evening.     benzonatate (TESSALON PERLES) 100 MG capsule Take 1 capsule (100 mg total) by mouth 3 (three) times daily as needed for cough. (Patient not taking: Reported on 03/02/2021) 30 capsule 0   predniSONE (DELTASONE) 10 MG  tablet Take 5 tablets by mouth daily x3 days, 4 tablets daily x3 days, 3 tablets daily x3 days, 2 tablets daily x3 days then 1 tablet daily x3 days, then stop. (Patient not taking: Reported on 03/02/2021) 45 tablet 0   No facility-administered medications prior to visit.     Review of Systems:   Constitutional: No weight loss or gain, night sweats, fevers, chills, fatigue, or lassitude. HEENT: No headaches, difficulty  swallowing, tooth/dental problems, or sore throat. No sneezing, itching, ear ache, nasal congestion, or post nasal drip CV:  No chest pain, orthopnea, PND, swelling in lower extremities, anasarca, dizziness, palpitations, syncope Resp: +occasional productive cough with clear sputum. No shortness of breath with exertion or at rest. No excess mucus or change in color of mucus.  No hemoptysis. No wheezing.  No chest wall deformity GI:  No heartburn, indigestion, abdominal pain, nausea, vomiting, diarrhea, change in bowel habits, loss of appetite, bloody stools.  GU: No dysuria, change in color of urine, urgency or frequency.  No flank pain, no hematuria  Skin: No rash, lesions, ulcerations MSK:  No joint pain or swelling.  No decreased range of motion.  No back pain. Neuro: No dizziness or lightheadedness.  Psych: No depression or anxiety. Mood stable.     Physical Exam:  BP 134/62 (BP Location: Right Arm, Patient Position: Sitting, Cuff Size: Large)    Pulse 87    Temp 98 F (36.7 C) (Oral)    Ht 5\' 11"  (1.803 m)    Wt 280 lb (127 kg)    SpO2 96%    BMI 39.05 kg/m   GEN: Pleasant, interactive, well-appearing; obese; in no acute distress. HEENT:  Normocephalic and atraumatic. EACs patent bilaterally. TM pearly gray with present light reflex bilaterally. PERRLA. Sclera white. Nasal turbinates pink, moist and patent bilaterally. No rhinorrhea present. Oropharynx pink and moist, without exudate or edema. No lesions, ulcerations, or postnasal drip.  NECK:  Supple w/ fair ROM. No JVD present. Normal carotid impulses w/o bruits. Thyroid symmetrical with no goiter or nodules palpated. No lymphadenopathy.   CV: RRR, no m/r/g, no peripheral edema. Pulses intact, +2 bilaterally. No cyanosis, pallor or clubbing. PULMONARY:  Unlabored, regular breathing. Clear bilaterally A&P w/o wheezes/rales/rhonchi. No accessory muscle use. No dullness to percussion. GI: BS present and normoactive. Soft, non-tender to  palpation. No organomegaly or masses detected. No CVA tenderness. MSK: No erythema, warmth or tenderness. Cap refil <2 sec all extrem. No deformities or joint swelling noted.  Neuro: A/Ox3. No focal deficits noted.   Skin: Warm, no lesions or rashe Psych: Normal affect and behavior. Judgement and thought content appropriate.     Lab Results:  CBC    Component Value Date/Time   WBC 16.2 (H) 02/27/2021 0544   RBC 5.20 02/27/2021 0544   HGB 14.2 02/27/2021 0544   HCT 43.8 02/27/2021 0544   PLT 214 02/27/2021 0544   MCV 84.2 02/27/2021 0544   MCH 27.3 02/27/2021 0544   MCHC 32.4 02/27/2021 0544   RDW 13.7 02/27/2021 0544   LYMPHSABS 1.1 02/27/2021 0544   MONOABS 1.4 (H) 02/27/2021 0544   EOSABS 0.0 02/27/2021 0544   BASOSABS 0.0 02/27/2021 0544    BMET    Component Value Date/Time   NA 138 02/27/2021 0544   K 4.0 02/27/2021 0544   CL 105 02/27/2021 0544   CO2 25 02/27/2021 0544   GLUCOSE 114 (H) 02/27/2021 0544   BUN 17 02/27/2021 0544   CREATININE 0.69 02/27/2021 0544  CALCIUM 9.0 02/27/2021 0544   GFRNONAA >60 02/27/2021 0544    BNP No results found for: BNP   Imaging:  DG Chest 2 View  Result Date: 02/25/2021 CLINICAL DATA:  26 year old male with history of dyspnea. EXAM: CHEST - 2 VIEW COMPARISON:  Chest x-ray 02/07/2021. FINDINGS: Lung volumes are normal. No consolidative airspace disease. No pleural effusions. No pneumothorax. No pulmonary nodule or mass noted. Pulmonary vasculature and the cardiomediastinal silhouette are within normal limits. IMPRESSION: No radiographic evidence of acute cardiopulmonary disease. Electronically Signed   By: Vinnie Langton M.D.   On: 02/25/2021 11:35   CT Angio Chest Pulmonary Embolism (PE) W or WO Contrast  Result Date: 02/26/2021 CLINICAL DATA:  26 year old male with history of shortness of breath and cough for the past 2 weeks. EXAM: CT ANGIOGRAPHY CHEST WITH CONTRAST TECHNIQUE: Multidetector CT imaging of the chest was  performed using the standard protocol during bolus administration of intravenous contrast. Multiplanar CT image reconstructions and MIPs were obtained to evaluate the vascular anatomy. RADIATION DOSE REDUCTION: This exam was performed according to the departmental dose-optimization program which includes automated exposure control, adjustment of the mA and/or kV according to patient size and/or use of iterative reconstruction technique. CONTRAST:  65mL OMNIPAQUE IOHEXOL 350 MG/ML SOLN COMPARISON:  No priors. FINDINGS: Cardiovascular: No filling defect in the central, lobar or segmental sized pulmonary arteries to suggest pulmonary embolism. Smaller subsegmental sized emboli can not be completely excluded secondary to extensive patient respiratory motion. Heart size is normal. There is no significant pericardial fluid, thickening or pericardial calcification. No atherosclerotic calcifications are noted in the thoracic aorta or the coronary arteries. Mediastinum/Nodes: No pathologically enlarged mediastinal or hilar lymph nodes. Esophagus is unremarkable in appearance. No axillary lymphadenopathy. Lungs/Pleura: Ground-glass attenuation in the right middle lobe and left lower lobe is nonspecific, but likely of infectious or inflammatory etiology. No acute consolidative airspace disease. No pleural effusions. No suspicious appearing pulmonary nodules or masses are noted. Upper Abdomen: Unremarkable. Musculoskeletal: There are no aggressive appearing lytic or blastic lesions noted in the visualized portions of the skeleton. Review of the MIP images confirms the above findings. IMPRESSION: 1. Although slightly limited by patient respiratory motion, there is no evidence of clinically significant central, lobar or segmental sized pulmonary embolism. 2. Patchy areas of ground-glass attenuation are noted in the right middle lobe and left lower lobe, likely of infectious or inflammatory etiology. Electronically Signed   By:  Vinnie Langton M.D.   On: 02/26/2021 12:47      No flowsheet data found.  No results found for: NITRICOXIDE      Assessment & Plan:   Severe persistent asthma Significant improvement post hospitalization; although, he is currently on day 3 of 15 day prednisone taper. Reports feeling well without SOB. Mild residual cough with clear sputum production, primarily after breathing treatments. Continue Dulera 2 puffs Twice daily and nebs Twice daily until seen back in office then can decrease nebs to as needed. Continue prednisone taper. Start singulair 10 mg At bedtime for marked elevation of eosinophils. Will await allergen panel/Ige which was drawn inpatient. Close follow up in one month with Dr. Chase Caller.  Patient Instructions  -Continue Albuterol inhaler 2 puffs or 3 mL neb every 6 hours as needed for shortness of breath or wheezing. Notify if symptoms persist despite rescue inhaler/neb use. -Continue Dulera 2 puffs Twice daily. Brush tongue and rinse mouth afterwards  -Complete prednisone taper pack. Take in AM with food. Notify if you notice worsening in  your breathing upon decreased dose or once you finish the prednisone.  -Continue mucinex 600 mg Twice daily  -Continue flutter valve  Start singulair 10 mg At bedtime   Asthma Action Plan in place Rinse mouth after inhaled corticosteroid use.  Avoid triggers, when able.  Exercise encouraged. Notify if worsening symptoms upon exertion.  Notify and seek help if symptoms unrelieved by rescue inhaler.  We are still awaiting your allergen panel results. We will notify you of these once they come back!  Follow up in one month with Dr. Chase Caller. If symptoms do not improve or worsen, please contact office for sooner follow up or seek emergency care.    Clayton Bibles, NP 03/02/2021  Pt aware and understands NP's role.

## 2021-03-02 NOTE — Assessment & Plan Note (Addendum)
Significant improvement post hospitalization; although, he is currently on day 3 of 15 day prednisone taper. Reports feeling well without SOB. Mild residual cough with clear sputum production, primarily after breathing treatments. Continue Dulera 2 puffs Twice daily and nebs Twice daily until seen back in office then can decrease nebs to as needed. Continue prednisone taper. Start singulair 10 mg At bedtime for marked elevation of eosinophils. Will await allergen panel/Ige which was drawn inpatient. Close follow up in one month with Dr. Marchelle Gearing.  Patient Instructions  -Continue Albuterol inhaler 2 puffs or 3 mL neb every 6 hours as needed for shortness of breath or wheezing. Notify if symptoms persist despite rescue inhaler/neb use. -Continue Dulera 2 puffs Twice daily. Brush tongue and rinse mouth afterwards  -Complete prednisone taper pack. Take in AM with food. Notify if you notice worsening in your breathing upon decreased dose or once you finish the prednisone.  -Continue mucinex 600 mg Twice daily  -Continue flutter valve  Start singulair 10 mg At bedtime   Asthma Action Plan in place Rinse mouth after inhaled corticosteroid use.  Avoid triggers, when able.  Exercise encouraged. Notify if worsening symptoms upon exertion.  Notify and seek help if symptoms unrelieved by rescue inhaler.  We are still awaiting your allergen panel results. We will notify you of these once they come back!  Follow up in one month with Dr. Marchelle Gearing. If symptoms do not improve or worsen, please contact office for sooner follow up or seek emergency care.

## 2021-03-07 LAB — ALLERGENS W/TOTAL IGE AREA 2
Alternaria Alternata IgE: <0.1 kU/L
Aspergillus Fumigatus IgE: <0.1 kU/L
Bermuda Grass IgE: 6.71 kU/L — AB
Cat Dander IgE: 0.23 kU/L — AB
Cedar, Mountain IgE: 0.1 kU/L — AB
Cladosporium Herbarum IgE: <0.1 kU/L
Cockroach, German IgE: <0.1 kU/L
Common Silver Birch IgE: <0.1 kU/L
Cottonwood IgE: <0.1 kU/L
D Farinae IgE: <0.1 kU/L
D Pteronyssinus IgE: <0.1 kU/L
Dog Dander IgE: 0.38 kU/L — AB
Elm, American IgE: <0.1 kU/L
IgE (Immunoglobulin E), Serum: 442 IU/mL (ref 6–495)
Johnson Grass IgE: 4.8 kU/L — AB
Maple/Box Elder IgE: <0.1 kU/L
Mouse Urine IgE: <0.1 kU/L
Oak, White IgE: <0.1 kU/L
Pecan, Hickory IgE: <0.1 kU/L
Penicillium Chrysogen IgE: <0.1 kU/L
Pigweed, Rough IgE: <0.1 kU/L
Ragweed, Short IgE: <0.1 kU/L
Sheep Sorrel IgE Qn: <0.1 kU/L
Timothy Grass IgE: 29.9 kU/L — AB
White Mulberry IgE: UNDETERMINED kU/L

## 2021-03-15 ENCOUNTER — Institutional Professional Consult (permissible substitution): Payer: Self-pay | Admitting: Internal Medicine

## 2021-03-15 ENCOUNTER — Ambulatory Visit (INDEPENDENT_AMBULATORY_CARE_PROVIDER_SITE_OTHER): Payer: Self-pay | Admitting: Primary Care

## 2021-03-15 ENCOUNTER — Other Ambulatory Visit: Payer: Self-pay

## 2021-03-15 ENCOUNTER — Encounter (INDEPENDENT_AMBULATORY_CARE_PROVIDER_SITE_OTHER): Payer: Self-pay | Admitting: Primary Care

## 2021-03-15 VITALS — BP 125/74 | HR 85 | Temp 98.2°F | Ht 71.0 in | Wt 291.2 lb

## 2021-03-15 DIAGNOSIS — Z09 Encounter for follow-up examination after completed treatment for conditions other than malignant neoplasm: Secondary | ICD-10-CM

## 2021-03-15 DIAGNOSIS — R058 Other specified cough: Secondary | ICD-10-CM

## 2021-03-15 DIAGNOSIS — J4520 Mild intermittent asthma, uncomplicated: Secondary | ICD-10-CM

## 2021-03-15 DIAGNOSIS — Z7689 Persons encountering health services in other specified circumstances: Secondary | ICD-10-CM

## 2021-03-15 NOTE — Patient Instructions (Signed)
Asthma, Adult °Asthma is a long-term (chronic) condition that causes recurrent episodes in which the airways become tight and narrow. The airways are the passages that lead from the nose and mouth down into the lungs. Asthma episodes, also called asthma attacks, can cause coughing, wheezing, shortness of breath, and chest pain. The airways can also fill with mucus. During an attack, it can be difficult to breathe. Asthma attacks can range from minor to life threatening. °Asthma cannot be cured, but medicines and lifestyle changes can help control it and treat acute attacks. °What are the causes? °This condition is believed to be caused by inherited (genetic) and environmental factors, but its exact cause is not known. °There are many things that can bring on an asthma attack or make asthma symptoms worse (triggers). Asthma triggers are different for each person. Common triggers include: °Mold. °Dust. °Cigarette smoke. °Cockroaches. °Things that can cause allergy symptoms (allergens), such as animal dander or pollen from trees or grass. °Air pollutants such as household cleaners, wood smoke, smog, or chemical odors. °Cold air, weather changes, and winds (which increase molds and pollen in the air). °Strong emotional expressions such as crying or laughing hard. °Stress. °Certain medicines (such as aspirin) or types of medicines (such as beta-blockers). °Sulfites in foods and drinks. Foods and drinks that may contain sulfites include dried fruit, potato chips, and sparkling grape juice. °Infections or inflammatory conditions such as the flu, a cold, or inflammation of the nasal membranes (rhinitis). °Gastroesophageal reflux disease (GERD). °Exercise or strenuous activity. °What are the signs or symptoms? °Symptoms of this condition may occur right after asthma is triggered or many hours later. Symptoms include: °Wheezing. This can sound like whistling when you breathe. °Excessive nighttime or early morning  coughing. °Frequent or severe coughing with a common cold. °Chest tightness. °Shortness of breath. °Tiredness (fatigue) with minimal activity. °How is this diagnosed? °This condition is diagnosed based on: °Your medical history. °A physical exam. °Tests, which may include: °Lung function studies and pulmonary studies (spirometry). These tests can evaluate the flow of air in your lungs. °Allergy tests. °Imaging tests, such as X-rays. °How is this treated? °There is no cure for this condition, but treatment can help control your symptoms. Treatment for asthma usually involves: °Identifying and avoiding your asthma triggers. °Using medicines to control your symptoms. Generally, two types of medicines are used to treat asthma: °Controller medicines. These help prevent asthma symptoms from occurring. They are usually taken every day. °Fast-acting reliever or rescue medicines. These quickly relieve asthma symptoms by widening the narrow and tight airways. They are used as needed and provide short-term relief. °Using supplemental oxygen. This may be needed during a severe episode. °Using other medicines, such as: °Allergy medicines, such as antihistamines, if your asthma attacks are triggered by allergens. °Immune medicines (immunomodulators). These are medicines that help control the immune system. °Creating an asthma action plan. An asthma action plan is a written plan for managing and treating your asthma attacks. This plan includes: °A list of your asthma triggers and how to avoid them. °Information about when medicines should be taken and when their dosage should be changed. °Instructions about using a device called a peak flow meter. A peak flow meter measures how well the lungs are working and the severity of your asthma. It helps you monitor your condition. °Follow these instructions at home: °Controlling your home environment °Control your home environment in the following ways to help avoid triggers and prevent  asthma attacks: °Change your heating   and air conditioning filter regularly. °Limit your use of fireplaces and wood stoves. °Get rid of pests (such as roaches and mice) and their droppings. °Throw away plants if you see mold on them. °Clean floors and dust surfaces regularly. Use unscented cleaning products. °Try to have someone else vacuum for you regularly. Stay out of rooms while they are being vacuumed and for a short while afterward. If you vacuum, use a dust mask from a hardware store, a double-layered or microfilter vacuum cleaner bag, or a vacuum cleaner with a HEPA filter. °Replace carpet with wood, tile, or vinyl flooring. Carpet can trap dander and dust. °Use allergy-proof pillows, mattress covers, and box spring covers. °Keep your bedroom a trigger-free room. °Avoid pets and keep windows closed when allergens are in the air. °Wash beddings every week in hot water and dry them in a dryer. °Use blankets that are made of polyester or cotton. °Clean bathrooms and kitchens with bleach. If possible, have someone repaint the walls in these rooms with mold-resistant paint. Stay out of the rooms that are being cleaned and painted. °Wash your hands often with soap and water. If soap and water are not available, use hand sanitizer. °Do not allow anyone to smoke in your home. °General instructions °Take over-the-counter and prescription medicines only as told by your health care provider. °Speak with your health care provider if you have questions about how or when to take the medicines. °Make note if you are requiring more frequent dosages. °Do not use any products that contain nicotine or tobacco, such as cigarettes and e-cigarettes. If you need help quitting, ask your health care provider. Also, avoid being exposed to secondhand smoke. °Use a peak flow meter as told by your health care provider. Record and keep track of the readings. °Understand and use the asthma action plan to help minimize, or stop an asthma  attack, without needing to seek medical care. °Make sure you stay up to date on your yearly vaccinations as told by your health care provider. This may include vaccines for the flu and pneumonia. °Avoid outdoor activities when allergen counts are high and when air quality is low. °Wear a ski mask that covers your nose and mouth during outdoor winter activities. Exercise indoors on cold days if you can. °Warm up before exercising, and take time for a cool-down period after exercise. °Keep all follow-up visits as told by your health care provider. This is important. °Where to find more information °For information about asthma, turn to the Centers for Disease Control and Prevention at www.cdc.gov/asthma/faqs °For air quality information, turn to [employer]Now at airnow.gov °Contact a health care provider if: °You have wheezing, shortness of breath, or a cough even while you are taking medicine to prevent attacks. °The mucus you cough up (sputum) is thicker than usual. °Your sputum changes from clear or white to yellow, green, gray, or bloody. °Your medicines are causing side effects, such as a rash, itching, swelling, or trouble breathing. °You need to use a reliever medicine more than 2-3 times a week. °Your peak flow reading is still at 50-79% of your personal best after following your action plan for 1 hour. °You have a fever. °Get help right away if: °You are getting worse and do not respond to treatment during an asthma attack. °You are short of breath when at rest or when doing very little physical activity. °You have difficulty eating, drinking, or talking. °You have chest pain or tightness. °You develop a fast heartbeat or   palpitations. °You have a bluish color to your lips or fingernails. °You are light-headed or dizzy, or you faint. °Your peak flow reading is less than 50% of your personal best. °You feel too tired to breathe normally. °Summary °Asthma is a long-term (chronic) condition that causes recurrent  episodes in which the airways become tight and narrow. These episodes can cause coughing, wheezing, shortness of breath, and chest pain. °Asthma cannot be cured, but medicines and lifestyle changes can help control it and treat acute attacks. °Make sure you understand how to avoid triggers and how and when to use your medicines. °Asthma attacks can range from minor to life threatening. Get help right away if you have an asthma attack and do not respond to treatment with your usual rescue medicines. °This information is not intended to replace advice given to you by your health care provider. Make sure you discuss any questions you have with your health care provider. °Document Revised: 10/30/2019 Document Reviewed: 06/03/2019 °[employer]vier Patient Education © 2022 [employer] ° °

## 2021-03-15 NOTE — Progress Notes (Signed)
[facility]   Subjective:   [name] is a 26 y.o. male presents for hospital follow up and establish care. Patient presented to the ED via personal transportation unable to drive and friend drove him. He was short of breath and wheezing for 24 hrs and unable to do ADL, any form of activity caused exertion.  Admitted date to the hospital was 02/25/21, patient was discharged from the hospital on 02/27/21, patient was admitted for: Shortness of breath.Today Patient has No headache, No chest pain, No abdominal pain - No Nausea, No new weakness tingling or numbness, No shortness of breath. He still has coughing with productivity and chest is sore from coughing.  Past Medical History:  Diagnosis Date   Asthma      Allergies  Allergen Reactions   Grass Pollen(K-O-R-T-Swt Vern) Cough    Rash and cough      Current Outpatient Medications on File Prior to Visit  Medication Sig Dispense Refill   albuterol (VENTOLIN HFA) 108 (90 Base) MCG/ACT inhaler Inhale 2 puffs into the lungs every 6 (six) hours as needed for wheezing or shortness of breath. 8.5 g 0   magnesium 30 MG tablet Take 30 mg by mouth every evening.     mometasone-formoterol (DULERA) 200-5 MCG/ACT AERO Inhale 2 puffs into the lungs 2 (two) times daily. 13 g 0   montelukast (SINGULAIR) 10 MG tablet Take 1 tablet (10 mg total) by mouth at bedtime. 30 tablet 3   riboflavin (VITAMIN B-2) 100 MG TABS tablet Take 100 mg by mouth every evening.     albuterol (PROVENTIL) (2.5 MG/3ML) 0.083% nebulizer solution Take 3 mLs (2.5 mg total) by nebulization every 6 (six) hours as needed for wheezing or shortness of breath. 90 mL 0   benzonatate (TESSALON PERLES) 100 MG capsule Take 1 capsule (100 mg total) by mouth 3 (three) times daily as needed for cough. (Patient not taking: Reported on 03/02/2021) 30 capsule 0   No current facility-administered medications on file prior to visit.     Review of System: Comprehensive ROS  Pertinent positive and negative noted in HPI    Objective:  BP 125/74 (BP Location: Right Arm, Patient Position: Sitting, Cuff Size: Large)    Pulse 85    Temp 98.2 F (36.8 C) (Oral)    Ht 5\' 11"  (1.803 m)    Wt 291 lb 3.2 oz (132.1 kg)    SpO2 98%    BMI 40.61 kg/m   Filed Weights   03/15/21 0935  Weight: 291 lb 3.2 oz (132.1 kg)    Physical Exam: General Appearance: Well nourished, morbid obesity in no apparent distress. Eyes: PERRLA, EOMs, conjunctiva no swelling or erythema Sinuses: No Frontal/maxillary tenderness ENT/Mouth: Ext aud canals clear, TMs without erythema, bulging.  Hearing normal.  Neck: Supple, thyroid normal.  Respiratory: Respiratory effort normal, BS equal bilaterally without rales, rhonchi, wheezing or stridor.  Cardio: RRR with no MRGs. Brisk peripheral pulses without edema.  Abdomen: Soft, + BS.  Non tender, no guarding, rebound, hernias, masses. Lymphatics: Non tender without lymphadenopathy.  Musculoskeletal: Full ROM, 5/5 strength, normal gait.  Skin: Warm, dry without rashes, lesions, ecchymosis.  Neuro: Cranial nerves intact. Normal muscle tone, no cerebellar symptoms. Sensation intact.  Psych: Awake and oriented X 3, normal affect, Insight and Judgment appropriate.    Assessment:  Quinten was seen today for hospitalization follow-up.  Diagnoses and all orders for this visit:  Hospital discharge follow-up [facility] Discussed findings,  test results, plan of care, and proper follow up with patient. Patient is in agreement to plan and all questions were answered to the patient's satisfaction. Patient will be discharged with follow up to their PCP or return to the ED if symptoms persist, worsen or change. If patient was given medication(s), adverse effects, black box warnings, and drug interactions were discussed with patient.  Encounter to establish care Establish care with PCP   Intermittent asthma without complication,  unspecified asthma severity He admits to uncontrolled asthma for the past 2 years with frequent recurrent attacks.  Reach out to Dr. Delford Field to see patient . He has seen pulmonology and has a f/u appt cost >$300 affordability is not there. He was made aware testing could not due testing but could evaluate and treat asthma.   Productive cough Irritant DDX is cough variant asthma, upper airway cough syndrome (previously termed postnasal drip syndrome)    Morbid obesity (HCC) Morbid Obesity is > 40 BMI indicating an excess in caloric intake or underlining conditions. This may lead to other co-morbidities. Lifestyle modifications of diet and exercise may reduce obesity.     This note has been created with Education officer, environmental. Any transcriptional errors are unintentional.   Grayce Sessions, NP 03/15/2021, 9:46 AM

## 2021-04-10 ENCOUNTER — Ambulatory Visit: Payer: Self-pay | Admitting: Internal Medicine

## 2021-05-02 ENCOUNTER — Other Ambulatory Visit: Payer: Self-pay

## 2021-05-02 ENCOUNTER — Ambulatory Visit: Payer: PRIVATE HEALTH INSURANCE | Attending: Critical Care Medicine | Admitting: Critical Care Medicine

## 2021-05-02 ENCOUNTER — Encounter: Payer: Self-pay | Admitting: Critical Care Medicine

## 2021-05-02 DIAGNOSIS — J4550 Severe persistent asthma, uncomplicated: Secondary | ICD-10-CM

## 2021-05-02 DIAGNOSIS — E66813 Obesity, class 3: Secondary | ICD-10-CM

## 2021-05-02 DIAGNOSIS — F17290 Nicotine dependence, other tobacco product, uncomplicated: Secondary | ICD-10-CM

## 2021-05-02 DIAGNOSIS — Z6841 Body Mass Index (BMI) 40.0 and over, adult: Secondary | ICD-10-CM

## 2021-05-02 DIAGNOSIS — G479 Sleep disorder, unspecified: Secondary | ICD-10-CM

## 2021-05-02 MED ORDER — ALBUTEROL SULFATE HFA 108 (90 BASE) MCG/ACT IN AERS: 2.0000 | INHALATION_SPRAY | Freq: Four times a day (QID) | RESPIRATORY_TRACT | 0 refills | Status: DC | PRN

## 2021-05-02 MED ORDER — AMOXICILLIN-POT CLAVULANATE 875-125 MG PO TABS: 1.0000 | ORAL_TABLET | Freq: Two times a day (BID) | ORAL | 0 refills | Status: DC

## 2021-05-02 MED ORDER — MONTELUKAST SODIUM 10 MG PO TABS: 10.0000 mg | ORAL_TABLET | Freq: Every day | ORAL | 3 refills | Status: DC

## 2021-05-02 MED ORDER — NICOTINE POLACRILEX 4 MG MT LOZG: LOZENGE | OROMUCOSAL | 4 refills | Status: DC

## 2021-05-02 MED ORDER — AEROCHAMBER MV MISC: 0 refills | Status: DC

## 2021-05-02 MED ORDER — FLUTICASONE PROPIONATE 50 MCG/ACT NA SUSP: 2.0000 | Freq: Every day | NASAL | 6 refills | Status: DC

## 2021-05-02 MED ORDER — MOMETASONE FURO-FORMOTEROL FUM 200-5 MCG/ACT IN AERO: 2.0000 | INHALATION_SPRAY | Freq: Two times a day (BID) | RESPIRATORY_TRACT | 0 refills | Status: DC

## 2021-05-02 MED ORDER — ALBUTEROL SULFATE (2.5 MG/3ML) 0.083% IN NEBU: 2.5000 mg | INHALATION_SOLUTION | Freq: Four times a day (QID) | RESPIRATORY_TRACT | 0 refills | Status: DC | PRN

## 2021-05-02 MED ORDER — NICOTINE 21 MG/24HR TD PT24: 21.0000 mg | MEDICATED_PATCH | Freq: Every day | TRANSDERMAL | 0 refills | Status: DC

## 2021-05-02 NOTE — Assessment & Plan Note (Signed)
Discussed briefly a healthy diet ?

## 2021-05-02 NOTE — Assessment & Plan Note (Signed)
? ? ??   Current smoking consumption amount: 0.5% nicotine product goes to 1 vape cartridge a day ? ?? Dicsussion on advise to quit smoking and smoking impacts: Lung impacts cardiovascular impacts ? ?? Patient's willingness to quit: Wants to quit ? ?? Methods to quit smoking discussed: Behavioral modification nicotine replacement ? ?? Medication management of smoking session drugs discussed: Nicotine patch and lozenge combination approach ? ?? Resources provided:  AVS  ? ?? Setting quit date 12 weeks ? ?? Follow-up arranged follow-up with pulmonary ? ? ?Time spent counseling the patient: 5 minutes ? ?

## 2021-05-02 NOTE — Patient Instructions (Addendum)
Use nicotine lozenge and nicotine patch to quit smoking that is to quit vaping nicotine ? ?Stay on Dulera 2 puffs twice daily and use a spacer device I sent a prescription for the spacer device to the pharmacy ? ?Refills on albuterol sent both inhaler and nebulizer ? ?Take Augmentin twice a day for 10 days for sinus infection ? ?Keep follow-up appointments at pulmonary medicine I will ensure that you have a referral back to them you will need to see the pulmonary specialist for further allergy testing lung function testing and consideration for an immune biological for your asthma ? ?Reflux medication was also sent take pantoprazole daily before meals ? ?Try to not allow the cats to be in your bedroom ? ?Your pulmonary follow-up will be with pulmonary clinic ? ?Dr. Delford Field is available to you as needed if you cannot get back in with pulmonary medicine ?

## 2021-05-02 NOTE — Assessment & Plan Note (Signed)
Patient does snore has difficulty with sleep he would likely also benefit from a sleep evaluation at some point ?

## 2021-05-02 NOTE — Assessment & Plan Note (Signed)
Severe persistent asthma with elevated immunoglobulin E and positive serum IgE results to cat dander dog dander and various grasses and tree pollens ? ?Patient has elevated  absolute eosinophil count on his CBC ? ?Patient also has acute sinusitis at this time unresolved ? ?Plan is to refill Dulera at current dose level patient observed for his HFA technique and it was excellent I will give him a spacer device ? ?We will refill albuterol ? ?We will administer Augmentin for 10 days for sinusitis ? ?Continue Flonase nasal spray ? ?We will give reflux measures with Protonix ? ?He would benefit from pulmonary function testing and probably molecular biological I will make sure he gets back in with pulmonary medicine for follow-up ? ?I am happy to see the patient in the future if necessary but he now has full insurance with his current job therefore he should be able to get his care including one of the newer molecular biologicals at the pulmonary clinic ?

## 2021-05-02 NOTE — Progress Notes (Signed)
? ?New Patient Office Visit ? ?Subjective:  ?Patient ID: Cody Ward, male    DOB: 1995-04-03  Age: 26 y.o. MRN: [id number] ? ?CC:  ?Chief Complaint  ?Patient presents with  ? Asthma  ? Medication Refill  ? ? ?HPI ?Cody Ward presents for asthma referral from primary care. ? ?This patient has a longstanding history of asthma since he was a child.  He has had worsening symptoms since he was a teenager for the past 2 years.  He has constant difficulty with dyspnea wheezing chest tightness nasal congestion nasal sinus pressure.  Recent evaluation in the pulmonary clinic showed that he had hypersensitivity to animal dander and environmental allergens.  Steroids appear to reverse his symptoms.  He is also vaping nicotine products excessively.  He needs refills on his Dulera and albuterol.  He was given Singulair by pulmonary but he never picked up this prescription. ? ?IgE levels are elevated.  He has eosinophils that are increased as well.  Patient also has reflux symptoms ? ?Below is a copy of recent discharge summary ?Admit date: 02/25/2021 ?Discharge date: 02/27/2021 ?  ?Time spent: 25 minutes. ?  ?Recommendations for Outpatient Follow-up:  ?Follow-up with pulmonary. ?Follow up with your primary care provider. ?Take your medications as prescribed. ?Continue to completely abstain from vaping. ?  ?Discharge Diagnoses:  ?    ?Active Hospital Problems  ?  Diagnosis Date Noted  ? Asthma exacerbation 02/25/2021  ?   ?Resolved Hospital Problems  ?No resolved problems to display.  ?  ?  ?Discharge Condition: Stable ?  ?Diet recommendation: Resume previous diet. ?  ?    ?Vitals:  ?  02/27/21 [id number] 02/27/21 0758  ?BP: (!) 143/69    ?Pulse: 70    ?Resp: 19    ?Temp: 97.9 ?F (36.6 ?C)    ?SpO2: 97% 96%  ?  ?  ?History of present illness:  ? Is Cody Ward Cody Ward is a 26 y.o. male with medical history significant for hypertension, GERD, asthma, tobacco use disorder, quit tobacco use and switched to vaping, obesity, who [facility]  ED with complaints of sudden onset shortness of breath similar but worse than previous asthma attacks.  Associated with a productive cough and chest congestion for weeks.  Reports having uncontrolled asthma for the past 2 years with frequent recurrent attacks.  Endorses use of vaping, started 2 years ago.  He is not aware of any particular triggers but states that when he lies down flat or when he is exposed to cold air his asthma tends to flare up.  He has a rescue inhaler.  Does not follow with pulmonary.  He presented to the ED for further evaluation and management of his symptomatology.  Upon presentation to the ED, O2 saturation in the mid 80s on room air.  Not on oxygen supplementation at baseline.  Patient placed on 2 L to maintain O2 saturation greater than 90%.  [facility], hospitalist team, called to admit. ?  ? ?Saw pulmonary 02/27/2021 ?26 year old male, never smoker followed for severe persistent asthma.  He is new to the pulmonary clinic and was last seen by Dr. Marchelle Gearingamaswamy during his most recent admission for asthma exacerbation.  Past medical history significant for obesity and GERD. ?  ?TEST/EVENTS:  ?02/25/2021 CXR 2 view: No acute cardiopulmonary disease.  Lungs clear. ?02/26/2021 CTA chest: Groundglass attenuation in the right middle lobe and left lower lobe, likely infectious or inflammatory.  No evidence of PE ?  ?02/25/2021-02/27/2021: Hospital admission for asthma  exacerbation.  PCCM consulted.  Required 2 L of oxygen during admission.  Flu and COVID-negative.  Treated with antibiotic therapy and steroids.  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  Allergen panel ordered.  Advised to discharge on Dulera 200 2 puffs twice a day.  Discharged on prednisone taper. ?  ?03/02/2021: Today - hospital f/u ?Patient presents today for hospital follow up. He was discharged on 1/16 after severe asthma exacerbation and noted to have eosinophilia up to 2000. Prior to admission, he was only  using albuterol. He was discharged on [facility] Twice daily. Today, he reports significant improvement in his breathing. He still has an occasional cough which usually occurs with his breathing treatments. It is productive with small amount of clear sputum. His cough is improving. He denies any significant wheezing, orthopnea, PND, chest pain, or lower extremity edema. He continues on [place] Twice daily and is using his breathing treatments twice a day, as directed upon discharge. Otherwise, he has not required his rescue inhaler. Currently awaiting allergen panel results form hospital stay. He is on his prednisone taper for 15 days. Overall, he feels well and offers no further complaints.  ?  ?Severe persistent asthma ?Significant improvement post hospitalization; although, he is currently on day 3 of 15 day prednisone taper. Reports feeling well without SOB. Mild residual cough with clear sputum production, primarily after breathing treatments. Continue Dulera 2 puffs Twice daily and nebs Twice daily until seen back in office then can decrease nebs to as needed. Continue prednisone taper. Start singulair 10 mg At bedtime for marked elevation of eosinophils. Will await allergen panel/Ige which was drawn inpatient. Close follow up in one month with Dr. Marchelle Gearing. ?  ?Patient Instructions  ?-Continue Albuterol inhaler 2 puffs or 3 mL neb every 6 hours as needed for shortness of breath or wheezing. Notify if symptoms persist despite rescue inhaler/neb use. ?-Continue Dulera 2 puffs Twice daily. Brush tongue and rinse mouth afterwards  ?-Complete prednisone taper pack. Take in AM with food. Notify if you notice worsening in your breathing upon decreased dose or once you finish the prednisone.  ?-Continue mucinex 600 mg Twice daily  ?-Continue flutter valve ?  ?Start singulair 10 mg At bedtime  ?  ?Asthma Action Plan in place ?Rinse mouth after inhaled corticosteroid use.  ?Avoid triggers, when able.  ?Exercise encouraged.  Notify if worsening symptoms upon exertion.  ?Notify and seek help if symptoms unrelieved by rescue inhaler. ?  ?We are still awaiting your allergen panel results. We will notify you of these once they come back! ?  ?Symptomatology improved.  States he feels a lot better. ?  ?02/27/2021: Patient was seen and examined at his bedside.  There were no acute events overnight.  He has no complaints.  He is eager to go home. ?  ?Hospital Course:  ?Principal Problem: ?  Asthma exacerbation ?  ?Resolved acute asthma exacerbation with concern for chronic bronchitis ?Suspect triggered by vaping ?Received IV steroids, bronchodilators. ?Received pulmonary toilet.   ?Received Z-Pak, Rocephin x2. ?Follow-up with pulmonary ?  ?Resolved acute hypoxemic respiratory failure secondary to above ?Not on oxygen supplementation at baseline. ?On admission required 2 L oxygen [place] to maintain O2 saturation greater than 90% ?CTA chest, ruled out PE ?  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 ?  ?Essential hypertension ?BP is at goal now that he is back on home Norvasc 5 mg daily. ?Follow-up  with your primary care provider ?  ?Obesity ?BMI 39 ?Recommend weight loss outpatient with regular physical activity and healthy dieting. ?  ?Prediabetes with hyperglycemia likely exacerbated by IV steroids ?Hemoglobin A1c 5.8% on 03/01/2021. ?  ?Resolved post repletion: Hypophosphatemia ?Serum phosphorus 1.4> 4.7 ?  ? ?Past Medical History:  ?Diagnosis Date  ? Asthma   ? ? ?History reviewed. No pertinent surgical history. ? ?Family History  ?Family history unknown: Yes  ? ? ?Social History  ? ?Socioeconomic History  ? Marital status: Single  ?  Spouse name: Not on file  ? Number of children: Not on file  ? Years of education: Not on file  ? Highest education level: Not on file  ?Occupational History  ? Not on file  ?Tobacco Use  ? Smoking status: Never  ?  Passive  exposure: Never  ? Smokeless tobacco: Never  ?Vaping Use  ? Vaping Use: Every day  ?Substance and Sexual Activity  ? Alcohol use: Not on file  ? Drug use: Not on file  ? Sexual activity: Not on file

## 2021-06-01 ENCOUNTER — Encounter: Payer: Self-pay | Admitting: Pulmonary Disease

## 2021-06-01 ENCOUNTER — Ambulatory Visit (INDEPENDENT_AMBULATORY_CARE_PROVIDER_SITE_OTHER): Payer: PRIVATE HEALTH INSURANCE | Admitting: Pulmonary Disease

## 2021-06-01 ENCOUNTER — Telehealth: Payer: Self-pay | Admitting: Internal Medicine

## 2021-06-01 VITALS — BP 130/84 | HR 86 | Ht 71.0 in | Wt 298.0 lb

## 2021-06-01 DIAGNOSIS — F17200 Nicotine dependence, unspecified, uncomplicated: Secondary | ICD-10-CM | POA: Diagnosis not present

## 2021-06-01 DIAGNOSIS — J4550 Severe persistent asthma, uncomplicated: Secondary | ICD-10-CM | POA: Diagnosis not present

## 2021-06-01 MED ORDER — BUPROPION HCL ER (SR) 150 MG PO TB12: 150.0000 mg | ORAL_TABLET | Freq: Two times a day (BID) | ORAL | 4 refills | Status: DC

## 2021-06-01 MED ORDER — SPIRIVA RESPIMAT 2.5 MCG/ACT IN AERS: 2.0000 | INHALATION_SPRAY | Freq: Every day | RESPIRATORY_TRACT | 0 refills | Status: DC

## 2021-06-01 NOTE — Addendum Note (Signed)
Addended by: Maurene Capes on: 06/01/2021 11:15 AM ? ? Modules accepted: Orders ? ?

## 2021-06-01 NOTE — Patient Instructions (Addendum)
Continue dulera 2 puffs twice daily ?- rinse mouth out after each use ? ?Start spiriva 2 puffs daily ?- let us know in 2-4 weeks if you notice improvement and we will send in prescription for new inhaler like breztri or trelelgy ellipta that has 3 medicines in it ? ?Use albuterol as needed ? ?Start montelukast 1 tab daily ?- this is an allergy medicine for asthma ? ?Recommendations for quitting vaping ?- Start nicotine patches 7mg  daily ?- Use mini nicotine lozenges as needed ?- Start wellbutrin 1 tab daily for 7 days and then take 1 tab twice daily there after if doing ok ? ?Quitting vaping will be the best thing you can do for your lung health ? ?Recommend establishing care with a counselor to help with your anxiety ? ?Follow up in 2 months with pulmonary function tests ?

## 2021-06-01 NOTE — Telephone Encounter (Signed)
? ?  Saw Dr Francine Graven 06/01/2021 -Test I ordered in hospital. Sharing with you ? ? ? ?SIGNATURE  ? ? ?Dr. Kalman Shan, M.D., F.C.C.P,  ?Pulmonary and Critical Care Medicine ?Staff Physician, [facility] ?Center Director - Interstitial Lung Disease  Program  ?Medical Director - [facility] ?[facility] ?[place], [place], [address] ? ?NPI Number:  NPI #[id number] ?DEA Number: [id number] ? ?Pager: [phone], If no answer  -> Check AMION or Try [phone] ?Telephone (clinical office): [phone] ?Telephone (research): [phone] ? ?8:49 PM ?06/01/2021 ? ? ? ? Latest Reference Range & Units 02/26/21 16:08 02/27/21 05:44  ?Class Description Allergens  Comment   ?Pecan, [place] IgE Class 0 kU/L <0.10   ?D Pteronyssinus IgE Class 0 kU/L <0.10   ?D Farinae IgE Class 0 kU/L <0.10   ?Cat Dander IgE Class 0/I kU/L 0.23 !   ?Dog Dander IgE Class I kU/L 0.38 !   ?[place] Grass IgE Class IV kU/L 6.71 !   ?[place] Grass IgE Class IV kU/L 4.80 !   ?Penicillium Chrysogen IgE Class 0 kU/L <0.10   ?Cladosporium Herbarum IgE Class 0 kU/L <0.10   ?Aspergillus Fumigatus IgE Class 0 kU/L <0.10   ?Alternaria Alternata IgE Class 0 kU/L <0.10   ?Common Silver Charletta Cousin IgE Class 0 kU/L <0.10   ?Oak, [place] IgE Class 0 kU/L <0.10   ?Elm, American IgE Class 0 kU/L <0.10   ?Maple/Box Elder IgE Class 0 kU/L <0.10   ?White Mulberry IgE kU/L QUANTITY NOT SUFFICIENT, UNABLE TO PERFORM TEST   ?[place], [place] IgE Class 0/I kU/L 0.10 !   ?Ragweed, Short IgE Class 0 kU/L <0.10   ?Pigweed, Rough IgE Class 0 kU/L <0.10   ?Cockroach, [place] IgE Class 0 kU/L <0.10   ?[place] IgE Class 0 kU/L <0.10   ?IgE (Immunoglobulin E), Serum 6 - 495 IU/mL [id number]  ?Timothy Grass IgE Class V kU/L 29.90 !   ?Sheep Sorrel IgE Qn Class 0 kU/L <0.10   ?Mouse Urine IgE Class 0 kU/L <0.10   ?!: Data is abnormal ?

## 2021-06-01 NOTE — Progress Notes (Signed)
? ?Synopsis: Referred in April 2023 for Asthma by Dr. Delford Field ? ?Subjective:  ? ?PATIENT ID: Cody Ward GENDER: male DOB: Sep 29, 1995, MRN: [id number] ? ?HPI ? ?Chief Complaint  ?Patient presents with  ? Consult  ?  Referred by Dr. Delford Field for asthma. States he was diagnosed with asthma as a child but it went away. Asthma symptoms returned about 2 years. Increased chest tightness and wheezing.   ? ?Cody Ward is a 26 year old male, daily vaper who is referred to pulmonary clinic for asthma.  ? ?He was admitted 1/14 to 1/16 for asthma exacerbation where he was seen by Dr. Marchelle Gearing in consult. Absolute eosinophil count was 2,000 on 1/14 and total IgE leve was 442. He has significant allergies to grasses based on allergy panel and mild allergies to pet dander. He followed up with Rhunette Croft, NP in clinic on 1/19 after being discharged on dulera 200-12mcg 2 puffs twice daily and feeling much better at time of visit. He was completing a 15 day prednisone taper. He was prescribed singulair at that visit. ? ?He was seen by Dr. Delford Field on 05/02/21. He never started singulair. His inhaler technique was appropriate and was given a spacer. He was treated with augmentin for 10 days for sinusitis and started on fluticasone nasal spray. He was started on pantoprazole for GERD.  ? ?He is a Production designer, theatre/television/film at [employer]. Vaping daily, he is vaping every 2 hours. He stopped smoking for vaping. Vaping for 2-4 years. Smoked cigarettes for 2 years before. He has cut back on the amount of vape he is using. He noticed an increase in his asthma symptoms over the past 2 years. ? ?He reports increasing chest tightness, cough and intermittent wheezing since coming off the steroids. He has not picked up the montelukast yet.  ? ?He has history of childhood asthma.  ? ?Past Medical History:  ?Diagnosis Date  ? Asthma   ?  ? ?Family History  ?Family history unknown: Yes  ?  ? ?Social History  ? ?Socioeconomic History  ? Marital status: Single  ?  Spouse  name: Not on file  ? Number of children: Not on file  ? Years of education: Not on file  ? Highest education level: Not on file  ?Occupational History  ? Not on file  ?Tobacco Use  ? Smoking status: Never  ?  Passive exposure: Never  ? Smokeless tobacco: Never  ?Vaping Use  ? Vaping Use: Every day  ?Substance and Sexual Activity  ? Alcohol use: Not on file  ? Drug use: Not on file  ? Sexual activity: Not on file  ?Other Topics Concern  ? Not on file  ?Social History Narrative  ? Not on file  ? ?Social Determinants of Health  ? ?Financial Resource Strain: Not on file  ?Food Insecurity: Not on file  ?Transportation Needs: Not on file  ?Physical Activity: Not on file  ?Stress: Not on file  ?Social Connections: Not on file  ?Intimate Partner Violence: Not on file  ?  ? ?Allergies  ?Allergen Reactions  ? Grass Pollen(K-O-R-T-Swt Vern) Cough  ?  Rash and cough  ?  ? ?Outpatient Medications Prior to Visit  ?Medication Sig Dispense Refill  ? albuterol (PROVENTIL) (2.5 MG/3ML) 0.083% nebulizer solution Take 3 mLs (2.5 mg total) by nebulization every 6 (six) hours as needed for wheezing or shortness of breath. 90 mL 0  ? albuterol (VENTOLIN HFA) 108 (90 Base) MCG/ACT inhaler Inhale 2 puffs into the lungs  every 6 (six) hours as needed for wheezing or shortness of breath. 8.5 g 0  ? magnesium 30 MG tablet Take 30 mg by mouth every evening.    ? mometasone-formoterol (DULERA) 200-5 MCG/ACT AERO Inhale 2 puffs into the lungs 2 (two) times daily. 13 g 0  ? montelukast (SINGULAIR) 10 MG tablet Take 1 tablet (10 mg total) by mouth at bedtime. 30 tablet 3  ? riboflavin (VITAMIN B-2) 100 MG TABS tablet Take 100 mg by mouth every evening.    ? Spacer/Aero-Holding Chambers (AEROCHAMBER MV) inhaler Use as instructed 1 each 0  ? amoxicillin-clavulanate (AUGMENTIN) 875-125 MG tablet Take 1 tablet by mouth 2 (two) times daily. 20 tablet 0  ? fluticasone (FLONASE) 50 MCG/ACT nasal spray Place 2 sprays into both nostrils daily. 16 g 6  ?  nicotine (NICODERM CQ - DOSED IN MG/24 HOURS) 21 mg/24hr patch Place 1 patch (21 mg total) onto the skin daily. 28 patch 0  ? nicotine polacrilex (NICORETTE MINI) 4 MG lozenge Use three times a day as needed for nicotine dependence 100 tablet 4  ? ?No facility-administered medications prior to visit.  ? ?Review of Systems  ?Constitutional:  Positive for malaise/fatigue. Negative for chills, fever and weight loss.  ?HENT:  Positive for congestion. Negative for sinus pain and sore throat.   ?Eyes: Negative.   ?Respiratory:  Positive for cough and wheezing. Negative for hemoptysis, sputum production and shortness of breath.   ?Cardiovascular:  Negative for chest pain, palpitations, orthopnea, claudication and leg swelling.  ?Gastrointestinal:  Negative for abdominal pain, heartburn, nausea and vomiting.  ?Genitourinary: Negative.   ?Musculoskeletal:  Negative for joint pain and myalgias.  ?Skin:  Negative for rash.  ?Neurological:  Negative for weakness.  ?Endo/Heme/Allergies:  Positive for environmental allergies.  ?Psychiatric/Behavioral: Negative.    ? ?Objective:  ? ?Vitals:  ? 06/01/21 1030  ?BP: 130/84  ?Pulse: 86  ?SpO2: 97%  ?Weight: 298 lb (135.2 kg)  ?Height: 5\' 11"  (1.803 m)  ? ? ?Physical Exam ?Constitutional:   ?   General: He is not in acute distress. ?   Appearance: He is obese.  ?HENT:  ?   Head: Normocephalic and atraumatic.  ?Eyes:  ?   Extraocular Movements: Extraocular movements intact.  ?   Conjunctiva/sclera: Conjunctivae normal.  ?   Pupils: Pupils are equal, round, and reactive to light.  ?Cardiovascular:  ?   Rate and Rhythm: Normal rate and regular rhythm.  ?   Pulses: Normal pulses.  ?   Heart sounds: Normal heart sounds. No murmur heard. ?Pulmonary:  ?   Effort: Pulmonary effort is normal.  ?   Breath sounds: Normal breath sounds. No wheezing, rhonchi or rales.  ?Abdominal:  ?   General: Bowel sounds are normal.  ?   Palpations: Abdomen is soft.  ?Musculoskeletal:  ?   Right lower leg: No  edema.  ?   Left lower leg: No edema.  ?Lymphadenopathy:  ?   Cervical: No cervical adenopathy.  ?Skin: ?   General: Skin is warm and dry.  ?Neurological:  ?   General: No focal deficit present.  ?   Mental Status: He is alert.  ?Psychiatric:     ?   Mood and Affect: Mood normal.     ?   Behavior: Behavior normal.     ?   Thought Content: Thought content normal.     ?   Judgment: Judgment normal.  ? ?CBC ?   ?Component Value Date/Time  ?  WBC 16.2 (H) 02/27/2021 0544  ? RBC 5.20 02/27/2021 0544  ? HGB 14.2 02/27/2021 0544  ? HCT 43.8 02/27/2021 0544  ? PLT 214 02/27/2021 0544  ? MCV 84.2 02/27/2021 0544  ? MCH 27.3 02/27/2021 0544  ? MCHC 32.4 02/27/2021 0544  ? RDW 13.7 02/27/2021 0544  ? LYMPHSABS 1.1 02/27/2021 0544  ? MONOABS 1.4 (H) 02/27/2021 0544  ? EOSABS 0.0 02/27/2021 0544  ? BASOSABS 0.0 02/27/2021 0544  ? ? ?  Latest Ref Rng & Units 02/27/2021  ?  5:44 AM 02/26/2021  ?  4:57 AM 02/25/2021  ? 11:18 AM  ?BMP  ?Glucose 70 - 99 mg/dL 109114   604140   [id number]    ?BUN 6 - 20 mg/dL 17   12   14     ?Creatinine 0.61 - 1.24 mg/dL 9.810.69   1.910.46   4.780.66    ?Sodium 135 - 145 mmol/L 138   138   135    ?Potassium 3.5 - 5.1 mmol/L 4.0   4.3   4.0    ?Chloride 98 - 111 mmol/L 105   107   105    ?CO2 22 - 32 mmol/L 25   23   25     ?Calcium 8.9 - 10.3 mg/dL 9.0   9.7   9.1    ? ?Chest imaging: ?CT Chest 02/26/21 ?1. Although slightly limited by patient respiratory motion, there is ?no evidence of clinically significant central, lobar or segmental ?sized pulmonary embolism. ?2. Patchy areas of ground-glass attenuation are noted in the right ?middle lobe and left lower lobe, likely of infectious or ?inflammatory etiology. ? ?PFT: ?   ? View : No data to display.  ?  ?  ?  ? ? ?Labs: ? ?Path: ? ?Echo: ? ?Heart Catheterization: ? ?Assessment & Plan:  ? ?Severe persistent asthma without complication - Plan: Pulmonary Function Test ? ?Vaping nicotine dependence, non-tobacco product - Plan: buPROPion (WELLBUTRIN SR) 150 MG 12 hr  tablet ? ?Discussion: ?Cody CraneDaniel Ilagan is a 26 year old male, daily vaper who is referred to pulmonary clinic for asthma.  ? ?He is having increase in fatigue, chest tightness, intermittent wheezing and shortness of breath sinc

## 2021-06-02 NOTE — Telephone Encounter (Signed)
great

## 2021-06-14 ENCOUNTER — Other Ambulatory Visit: Payer: Self-pay | Admitting: Pulmonary Disease

## 2021-06-14 ENCOUNTER — Telehealth: Payer: Self-pay | Admitting: Pulmonary Disease

## 2021-06-14 MED ORDER — PROAIR RESPICLICK 108 (90 BASE) MCG/ACT IN AEPB
1.0000 | INHALATION_SPRAY | Freq: Four times a day (QID) | RESPIRATORY_TRACT | 5 refills | Status: DC | PRN
Start: 2021-06-14 — End: 2023-10-20

## 2021-06-14 NOTE — Telephone Encounter (Signed)
Medications from outside sources need reconciliation.   ? ?  ?   ?Changes Requested ? ? levalbuterol (XOPENEX HFA) 45 MCG/ACT inhaler  ?    ? Changed from: Albuterol Sulfate (PROAIR RESPICLICK) [id number] (90 Base) MCG/ACT AEPB  ? Sig: N/A  ? Disp:  Not specified    Refills:  0  ? Start: 06/14/2021  ? Class: Normal  ? Last ordered: Today by Freddi Starr, MD Last refill: 06/14/2021  ? Rx #: [id number]  ? Pharmacy comment: Alternative Requested.  ?   ?To be filled at: [id number] - [place],  - [id number]  ?  ? ? ?Dr. Erin Fulling, please advise on alternative med. ?

## 2021-06-14 NOTE — Telephone Encounter (Signed)
Called and spoke with patient. He stated that he has been without his albuterol for 2 weeks. His insurance will not pay for the RX they have on file. The pharmacy supposedly sent a fax to our office. I advised him that the fax probably went to Dr. Lynelle Doctor office since his name was on the prescription.  ? ?RX has been sent for Proair to see if this is covered.  ? ?Called patient back to let him know but he did not answer. Left message for him to call back.  ?

## 2021-06-15 ENCOUNTER — Other Ambulatory Visit: Payer: Self-pay | Admitting: Pulmonary Disease

## 2021-06-15 MED ORDER — ALBUTEROL SULFATE HFA 108 (90 BASE) MCG/ACT IN AERS: 2.0000 | INHALATION_SPRAY | Freq: Four times a day (QID) | RESPIRATORY_TRACT | 5 refills | Status: DC | PRN

## 2021-06-16 ENCOUNTER — Ambulatory Visit (INDEPENDENT_AMBULATORY_CARE_PROVIDER_SITE_OTHER): Payer: Self-pay | Admitting: Primary Care

## 2021-06-19 ENCOUNTER — Telehealth: Payer: Self-pay | Admitting: Pulmonary Disease

## 2021-06-19 NOTE — Telephone Encounter (Signed)
Called and spoke with patient.  Patient stated he was taking Spiriva and feels it is working well.  Patient stated he has started Wellbutrin 4 days ago.  Patient stated he was taking 1 tab, but notice he felt fatigue and nausea, even when taking with food.  Patient stated he has taking 1/2 tab yesterday and today.  Patient stated nausea and fatigue are better with 1/2 tab vs 1 tab.  Patient stated Wellbutrin has helped and he would like to keep taking it, but is unsure 1/2 tab daily is ok, and if he should try increasing to 1 tab again? ?Patient stated if we call back and receive VM, it is ok to leave any recommendations Dr. Lanora Manis may have for him.  Patient stated he can call back with any further questions. ? ?Message routed to Dr. Francine Graven to advise on Wellbutrin  ?

## 2021-06-19 NOTE — Telephone Encounter (Signed)
Cody Sinner, MD ?to [facility] Triage Pool   ?   3:32 PM ? It is ok to continue taking 1/2 tab twice daily over the next 2 weeks if tolerating that dose. Can try to increase to 1 tab twice daily after the 2 weeks and if experiences similar symptoms then ok to return to reduced dose of 1/2 tab per day.  ? ?Thanks,  ?JD  ? ?I called and spoke with the pt and notified of response per Dr Francine Graven. He verbalized understanding. Nothing further needed.  ?

## 2021-06-29 NOTE — Telephone Encounter (Signed)
Called pt's pharmacy to see if pt was ever able to get rescue inhaler and was told that it was picked up 5/4. Nothing further needed.

## 2021-07-13 ENCOUNTER — Telehealth: Payer: Self-pay | Admitting: Pulmonary Disease

## 2021-07-13 MED ORDER — ALBUTEROL SULFATE (2.5 MG/3ML) 0.083% IN NEBU: 2.5000 mg | INHALATION_SOLUTION | Freq: Four times a day (QID) | RESPIRATORY_TRACT | 3 refills | Status: DC | PRN

## 2021-07-13 MED ORDER — SPIRIVA RESPIMAT 2.5 MCG/ACT IN AERS: 2.0000 | INHALATION_SPRAY | Freq: Every day | RESPIRATORY_TRACT | 11 refills | Status: DC

## 2021-07-13 NOTE — Telephone Encounter (Signed)
Patient is returning  phone call. Patient phone number is [phone]. 

## 2021-07-13 NOTE — Telephone Encounter (Signed)
ATC patient, LMTCB 

## 2021-07-13 NOTE — Telephone Encounter (Signed)
Per last OV note: "We will add spiriva 2.65mcg 2 puffs daily to the dulera 200-84mcg 2 puffs twice daily. He is to pick up the montelukast prescription as well.   If he notices improvement on ICS/LAMA/LABA therapy, we will send I prescription for trelegy or breztri inhaler. "  He should continue to take dulera 200 2 puffs twice daily and spiriva 2 puffs daily. If it is easier to send in script for Trelegy or Markus Daft based on his insurance then we can do that.   Has he stopped vaping?  Thanks, JD

## 2021-07-13 NOTE — Telephone Encounter (Signed)
Called and spoke with patient who states that he has shortness of breath and cough that has been getting worse since last OV. Having "fits" of shortness of breath, cough and chest tightness. Patient has asked for refill of his albuterol nebulizer medication. States he is using his rescue inhaler and was using Spiriva but needs that refilled as well. RX for neb and Spiriva have been sent in. Is he still supposed to be taking Dulera?  [facility]acy [facility] [place] [place].    Please advise

## 2021-07-13 NOTE — Telephone Encounter (Signed)
Called patient but he did not answer. Left message for him to call us back.  

## 2021-07-14 ENCOUNTER — Other Ambulatory Visit: Payer: Self-pay | Admitting: Pulmonary Disease

## 2021-07-14 MED ORDER — BREZTRI AEROSPHERE 160-9-4.8 MCG/ACT IN AERO: 2.0000 | INHALATION_SPRAY | Freq: Two times a day (BID) | RESPIRATORY_TRACT | 11 refills | Status: DC

## 2021-07-14 NOTE — Telephone Encounter (Signed)
I called and spoke with the pt  He states that he had been using the singulair, spiriva and dulera and was doing well with that  He states he ran out of dulera and spiriva 2 wks ago and was not sure if he had refills  He is still on singulair  He is vaping still but much less than before  He would like to try breztri since his ins covers  I have sent this in for him  He is aware to stop spiriva and dulera once starts breztri He is aware to call if not improving

## 2021-07-18 ENCOUNTER — Other Ambulatory Visit: Payer: Self-pay | Admitting: Pulmonary Disease

## 2021-07-18 NOTE — Telephone Encounter (Signed)
PA team, can we complete a PA for his Cody Ward? Thanks!

## 2021-07-20 ENCOUNTER — Other Ambulatory Visit (HOSPITAL_COMMUNITY): Payer: Self-pay

## 2021-07-27 ENCOUNTER — Other Ambulatory Visit (HOSPITAL_COMMUNITY): Payer: Self-pay

## 2021-07-27 ENCOUNTER — Telehealth: Payer: Self-pay | Admitting: Pharmacy Technician

## 2021-07-27 NOTE — Telephone Encounter (Signed)
PA has been submitted.

## 2021-07-27 NOTE — Telephone Encounter (Signed)
Patient Advocate Encounter  Received notification from [employer] that prior authorization for BREZTRI is required.   PA submitted on 6.15.23 Key [id number] Status is pending   [facility] will continue to follow  Ricke Hey, CPhT Patient Advocate Phone: [phone]

## 2021-08-02 ENCOUNTER — Ambulatory Visit (INDEPENDENT_AMBULATORY_CARE_PROVIDER_SITE_OTHER): Payer: PRIVATE HEALTH INSURANCE | Admitting: Pulmonary Disease

## 2021-08-02 ENCOUNTER — Encounter: Payer: Self-pay | Admitting: Pulmonary Disease

## 2021-08-02 VITALS — BP 138/82 | HR 83 | Ht 69.5 in | Wt 305.8 lb

## 2021-08-02 DIAGNOSIS — J4550 Severe persistent asthma, uncomplicated: Secondary | ICD-10-CM

## 2021-08-02 DIAGNOSIS — R0683 Snoring: Secondary | ICD-10-CM

## 2021-08-02 DIAGNOSIS — J309 Allergic rhinitis, unspecified: Secondary | ICD-10-CM

## 2021-08-02 DIAGNOSIS — R5383 Other fatigue: Secondary | ICD-10-CM

## 2021-08-02 LAB — PULMONARY FUNCTION TEST
DL/VA % pred: 108 %
DL/VA: 5.42 ml/min/mmHg/L
DLCO cor % pred: 110 %
DLCO cor: 35.91 ml/min/mmHg
DLCO unc % pred: 110 %
DLCO unc: 35.91 ml/min/mmHg
FEF 25-75 Post: 2.7 L/sec
FEF 25-75 Pre: 1.72 L/sec
FEF2575-%Change-Post: 56 %
FEF2575-%Pred-Post: 59 %
FEF2575-%Pred-Pre: 37 %
FEV1-%Change-Post: 16 %
FEV1-%Pred-Post: 79 %
FEV1-%Pred-Pre: 68 %
FEV1-Post: 3.57 L
FEV1-Pre: 3.07 L
FEV1FVC-%Change-Post: 8 %
FEV1FVC-%Pred-Pre: 79 %
FEV6-%Change-Post: 8 %
FEV6-%Pred-Post: 92 %
FEV6-%Pred-Pre: 86 %
FEV6-Post: 5.01 L
FEV6-Pre: 4.64 L
FEV6FVC-%Change-Post: 0 %
FEV6FVC-%Pred-Post: 100 %
FEV6FVC-%Pred-Pre: 99 %
FVC-%Change-Post: 7 %
FVC-%Pred-Post: 92 %
FVC-%Pred-Pre: 86 %
FVC-Post: 5.04 L
FVC-Pre: 4.7 L
Post FEV1/FVC ratio: 71 %
Post FEV6/FVC ratio: 99 %
Pre FEV1/FVC ratio: 65 %
Pre FEV6/FVC Ratio: 99 %
RV % pred: 134 %
RV: 2.06 L
TLC % pred: 106 %
TLC: 7.2 L

## 2021-08-02 MED ORDER — MONTELUKAST SODIUM 10 MG PO TABS: 10.0000 mg | ORAL_TABLET | Freq: Every day | ORAL | 3 refills | Status: DC

## 2021-08-02 MED ORDER — BREZTRI AEROSPHERE 160-9-4.8 MCG/ACT IN AERO: 2.0000 | INHALATION_SPRAY | Freq: Two times a day (BID) | RESPIRATORY_TRACT | 0 refills | Status: DC

## 2021-08-02 MED ORDER — ALBUTEROL SULFATE (2.5 MG/3ML) 0.083% IN NEBU: 2.5000 mg | INHALATION_SOLUTION | Freq: Four times a day (QID) | RESPIRATORY_TRACT | 3 refills | Status: DC | PRN

## 2021-08-02 MED ORDER — FLUTICASONE PROPIONATE 50 MCG/ACT NA SUSP: 1.0000 | Freq: Every day | NASAL | 2 refills | Status: AC

## 2021-08-02 NOTE — Patient Instructions (Addendum)
Take half a tab of wellbutrin twice daily  Use breztri inhaler 2 puffs twice daily over next 2 weeks until we can figure out which inhalers are covered by your insurance.  Continue to keep cutting down on the vape and ultimately quitting as this is contributing to ongoing inflammation of the lungs.   Continue montelukast daily  Continue fluticasone nasal spray  We will schedule you for a home sleep study to evaluate the fatigue.   Follow up in 4 months

## 2021-08-02 NOTE — Progress Notes (Unsigned)
Synopsis: Referred in April 2023 for Asthma by Dr. Delford Field  Subjective:   PATIENT ID: Cody Ward GENDER: male DOB: 17-Nov-1995, MRN: [id number]  HPI  Chief Complaint  Patient presents with   Follow-up    F/U after PFT. States his breathing has been more labored for the past few weeks. Finished a steroid taper about 1 1/2 weeks ago.    Cody Ward is a 26 year old male, daily vaper who returns to pulmonary clinic for asthma.   He has been out of his spiriva and dulera over the past couple of weeks and has noticed increased shortness of breath, cough and wheezing. He is using albuerol nebs and inhaler as needed. He received prednisone taper from urgent care 2 weeks ago which has helped.   He is vaping much less than before, about two to three times per day.   He started wellbutrin after last visit and is taking half tab per day. He did not tolerate the full tablet.   PFTs today show mild obstruction, significant bronchodilator response and air trapping.   Initial Visit 06/01/21 He was admitted 1/14 to 1/16 for asthma exacerbation where he was seen by Dr. Marchelle Gearing in consult. Absolute eosinophil count was 2,000 on 1/14 and total IgE leve was 442. He has significant allergies to grasses based on allergy panel and mild allergies to pet dander. He followed up with Rhunette Croft, NP in clinic on 1/19 after being discharged on dulera 200-53mcg 2 puffs twice daily and feeling much better at time of visit. He was completing a 15 day prednisone taper. He was prescribed singulair at that visit.  He was seen by Dr. Delford Field on 05/02/21. He never started singulair. His inhaler technique was appropriate and was given a spacer. He was treated with augmentin for 10 days for sinusitis and started on fluticasone nasal spray. He was started on pantoprazole for GERD.   He is a Production designer, theatre/television/film at [employer]. Vaping daily, he is vaping every 2 hours. He stopped smoking for vaping. Vaping for 2-4 years. Smoked cigarettes  for 2 years before. He has cut back on the amount of vape he is using. He noticed an increase in his asthma symptoms over the past 2 years.  He reports increasing chest tightness, cough and intermittent wheezing since coming off the steroids. He has not picked up the montelukast yet.   He has history of childhood asthma.   Past Medical History:  Diagnosis Date   Asthma      Family History  Family history unknown: Yes     Social History   Socioeconomic History   Marital status: Single    Spouse name: Not on file   Number of children: Not on file   Years of education: Not on file   Highest education level: Not on file  Occupational History   Not on file  Tobacco Use   Smoking status: Never    Passive exposure: Never   Smokeless tobacco: Never  Vaping Use   Vaping Use: Every day  Substance and Sexual Activity   Alcohol use: Not on file   Drug use: Not on file   Sexual activity: Not on file  Other Topics Concern   Not on file  Social History Narrative   Not on file   Social Determinants of Health   Financial Resource Strain: Not on file  Food Insecurity: Not on file  Transportation Needs: Not on file  Physical Activity: Not on file  Stress: Not on  file  Social Connections: Not on file  Intimate Partner Violence: Not on file     Allergies  Allergen Reactions   Grass Pollen(K-O-R-T-Swt Vern) Cough    Rash and cough     Outpatient Medications Prior to Visit  Medication Sig Dispense Refill   albuterol (VENTOLIN HFA) 108 (90 Base) MCG/ACT inhaler Inhale 2 puffs into the lungs every 6 (six) hours as needed for wheezing or shortness of breath. 8.5 g 5   Albuterol Sulfate (PROAIR RESPICLICK) 108 (90 Base) MCG/ACT AEPB Inhale 1-2 puffs into the lungs every 6 (six) hours as needed. 1 each 5   buPROPion (WELLBUTRIN SR) 150 MG 12 hr tablet Take 1 tablet (150 mg total) by mouth 2 (two) times daily. Take 1 tablet daily for the first 7 days, then 1 tablet twice daily there  after 60 tablet 4   magnesium 30 MG tablet Take 30 mg by mouth every evening.     riboflavin (VITAMIN B-2) 100 MG TABS tablet Take 100 mg by mouth every evening.     Spacer/Aero-Holding Chambers (AEROCHAMBER MV) inhaler Use as instructed 1 each 0   albuterol (PROVENTIL) (2.5 MG/3ML) 0.083% nebulizer solution Take 3 mLs (2.5 mg total) by nebulization every 6 (six) hours as needed for wheezing or shortness of breath. 90 mL 3   montelukast (SINGULAIR) 10 MG tablet Take 1 tablet (10 mg total) by mouth at bedtime. 30 tablet 3   Budeson-Glycopyrrol-Formoterol (BREZTRI AEROSPHERE) 160-9-4.8 MCG/ACT AERO Inhale 2 puffs into the lungs in the morning and at bedtime. (Patient not taking: Reported on 08/02/2021) 10.7 g 11   mometasone-formoterol (DULERA) 200-5 MCG/ACT AERO Inhale 2 puffs into the lungs 2 (two) times daily. (Patient not taking: Reported on 08/02/2021) 13 g 0   Tiotropium Bromide Monohydrate (SPIRIVA RESPIMAT) 2.5 MCG/ACT AERS Inhale 2 puffs into the lungs daily. (Patient not taking: Reported on 08/02/2021) 4 g 11   Tiotropium Bromide Monohydrate (SPIRIVA RESPIMAT) 2.5 MCG/ACT AERS Inhale 2 puffs into the lungs daily. 8 g 0   No facility-administered medications prior to visit.   Review of Systems  Constitutional:  Positive for malaise/fatigue. Negative for chills, fever and weight loss.  HENT:  Positive for congestion. Negative for sinus pain and sore throat.   Eyes: Negative.   Respiratory:  Positive for cough and wheezing. Negative for hemoptysis, sputum production and shortness of breath.   Cardiovascular:  Negative for chest pain, palpitations, orthopnea, claudication and leg swelling.  Gastrointestinal:  Negative for abdominal pain, heartburn, nausea and vomiting.  Genitourinary: Negative.   Musculoskeletal:  Negative for joint pain and myalgias.  Skin:  Negative for rash.  Neurological:  Negative for weakness.  Endo/Heme/Allergies:  Positive for environmental allergies.   Psychiatric/Behavioral: Negative.      Objective:   Vitals:   08/02/21 1206  BP: 138/82  Pulse: 83  SpO2: 98%  Weight: (!) 305 lb 12.8 oz (138.7 kg)  Height: 5' 9.5" (1.765 m)    Physical Exam Constitutional:      General: He is not in acute distress.    Appearance: He is obese.  HENT:     Head: Normocephalic and atraumatic.  Eyes:     Conjunctiva/sclera: Conjunctivae normal.  Cardiovascular:     Rate and Rhythm: Normal rate and regular rhythm.     Pulses: Normal pulses.     Heart sounds: Normal heart sounds. No murmur heard. Pulmonary:     Effort: Pulmonary effort is normal.     Breath sounds: Normal breath  sounds. No wheezing, rhonchi or rales.  Musculoskeletal:     Right lower leg: No edema.     Left lower leg: No edema.  Skin:    General: Skin is warm and dry.  Neurological:     General: No focal deficit present.     Mental Status: He is alert.  Psychiatric:        Mood and Affect: Mood normal.        Behavior: Behavior normal.        Thought Content: Thought content normal.        Judgment: Judgment normal.    CBC    Component Value Date/Time   WBC 16.2 (H) 02/27/2021 0544   RBC 5.20 02/27/2021 0544   HGB 14.2 02/27/2021 0544   HCT 43.8 02/27/2021 0544   PLT 214 02/27/2021 0544   MCV 84.2 02/27/2021 0544   MCH 27.3 02/27/2021 0544   MCHC 32.4 02/27/2021 0544   RDW 13.7 02/27/2021 0544   LYMPHSABS 1.1 02/27/2021 0544   MONOABS 1.4 (H) 02/27/2021 0544   EOSABS 0.0 02/27/2021 0544   BASOSABS 0.0 02/27/2021 0544      Latest Ref Rng & Units 02/27/2021    5:44 AM 02/26/2021    4:57 AM 02/25/2021   11:18 AM  BMP  Glucose 70 - 99 mg/dL 546  270  350   BUN 6 - 20 mg/dL 17  12  14    Creatinine 0.61 - 1.24 mg/dL  0.93  8.18   Sodium 135 - 145 mmol/L 138  138  135   Potassium 3.5 - 5.1 mmol/L 4.0  4.3  4.0   Chloride 98 - 111 mmol/L 105  107  105   CO2 22 - 32 mmol/L 25  23  25    Calcium 8.9 - 10.3 mg/dL 9.0  9.7  9.1    Chest imaging: CT  Chest 02/26/21 1. Although slightly limited by patient respiratory motion, there is no evidence of clinically significant central, lobar or segmental sized pulmonary embolism. 2. Patchy areas of ground-glass attenuation are noted in the right middle lobe and left lower lobe, likely of infectious or inflammatory etiology.  PFT:    Latest Ref Rng & Units 08/02/2021   11:00 AM  PFT Results  FVC-Pre L 4.70  P  FVC-Predicted Pre % 86  P  FVC-Post L 5.04  P  FVC-Predicted Post % 92  P  Pre FEV1/FVC % % 65  P  Post FEV1/FCV % % 71  P  FEV1-Pre L 3.07  P  FEV1-Predicted Pre % 68  P  FEV1-Post L 3.57  P  DLCO uncorrected ml/min/mmHg 35.91  P  DLCO UNC% % 110  P  DLCO corrected ml/min/mmHg 35.91  P  DLCO COR %Predicted % 110  P  DLVA Predicted % 108  P  TLC L 7.20  P  TLC % Predicted % 106  P  RV % Predicted % 134  P    P Preliminary result   Labs:  Path:  Echo:  Heart Catheterization:  Assessment & Plan:   Severe persistent asthma without complication - Plan: albuterol (PROVENTIL) (2.5 MG/3ML) 0.083% nebulizer solution, montelukast (SINGULAIR) 10 MG tablet  Allergic rhinitis, unspecified seasonality, unspecified trigger - Plan: fluticasone (FLONASE) 50 MCG/ACT nasal spray  Fatigue, unspecified type - Plan: Home sleep test  Snoring - Plan: Home sleep test  Discussion: Cody Ward is a 26 year old male, daily vaper who is referred to pulmonary clinic for asthma.  Prior auth for breztri has been denied. We will message our pharmacy team about an ICS/LAMA/LABA combination we can prescribe for him based on his insurance coverage.   We will need to check IgE and absolute eosinophil levels when he has been off prednisone for at least a month.   He continues to have fatigue and day time sleepiness. We will check a home sleep study.   He is to take wellbutrin half a tab twice daily.   Strongly encouraged him to quit vaping as this is contributing to a lot of his  respiratory symptoms.   Follow up in 4 months.  Melody Comas, MD  Pulmonary & Critical Care Office: [phone]   Current Outpatient Medications:    albuterol (VENTOLIN HFA) 108 (90 Base) MCG/ACT inhaler, Inhale 2 puffs into the lungs every 6 (six) hours as needed for wheezing or shortness of breath., Disp: 8.5 g, Rfl: 5   Albuterol Sulfate (PROAIR RESPICLICK) 108 (90 Base) MCG/ACT AEPB, Inhale 1-2 puffs into the lungs every 6 (six) hours as needed., Disp: 1 each, Rfl: 5   Budeson-Glycopyrrol-Formoterol (BREZTRI AEROSPHERE) 160-9-4.8 MCG/ACT AERO, Inhale 2 puffs into the lungs in the morning and at bedtime., Disp: 11.8 g, Rfl: 0   buPROPion (WELLBUTRIN SR) 150 MG 12 hr tablet, Take 1 tablet (150 mg total) by mouth 2 (two) times daily. Take 1 tablet daily for the first 7 days, then 1 tablet twice daily there after, Disp: 60 tablet, Rfl: 4   fluticasone (FLONASE) 50 MCG/ACT nasal spray, Place 1 spray into both nostrils daily., Disp: 16 g, Rfl: 2   magnesium 30 MG tablet, Take 30 mg by mouth every evening., Disp: , Rfl:    riboflavin (VITAMIN B-2) 100 MG TABS tablet, Take 100 mg by mouth every evening., Disp: , Rfl:    Spacer/Aero-Holding Chambers (AEROCHAMBER MV) inhaler, Use as instructed, Disp: 1 each, Rfl: 0   albuterol (PROVENTIL) (2.5 MG/3ML) 0.083% nebulizer solution, Take 3 mLs (2.5 mg total) by nebulization every 6 (six) hours as needed for wheezing or shortness of breath., Disp: 90 mL, Rfl: 3   Budeson-Glycopyrrol-Formoterol (BREZTRI AEROSPHERE) 160-9-4.8 MCG/ACT AERO, Inhale 2 puffs into the lungs in the morning and at bedtime. (Patient not taking: Reported on 08/02/2021), Disp: 10.7 g, Rfl: 11   mometasone-formoterol (DULERA) 200-5 MCG/ACT AERO, Inhale 2 puffs into the lungs 2 (two) times daily. (Patient not taking: Reported on 08/02/2021), Disp: 13 g, Rfl: 0   montelukast (SINGULAIR) 10 MG tablet, Take 1 tablet (10 mg total) by mouth at bedtime., Disp: 30 tablet, Rfl: 3    Tiotropium Bromide Monohydrate (SPIRIVA RESPIMAT) 2.5 MCG/ACT AERS, Inhale 2 puffs into the lungs daily. (Patient not taking: Reported on 08/02/2021), Disp: 4 g, Rfl: 11

## 2021-08-02 NOTE — Progress Notes (Signed)
PFT done today. 

## 2021-08-03 ENCOUNTER — Other Ambulatory Visit (HOSPITAL_COMMUNITY): Payer: Self-pay

## 2021-08-05 ENCOUNTER — Encounter: Payer: Self-pay | Admitting: Pulmonary Disease

## 2021-08-07 ENCOUNTER — Other Ambulatory Visit (HOSPITAL_COMMUNITY): Payer: Self-pay

## 2021-08-10 ENCOUNTER — Other Ambulatory Visit (HOSPITAL_COMMUNITY): Payer: Self-pay

## 2021-08-10 NOTE — Telephone Encounter (Signed)
[facility] Patient Advocate Encounter  Received notification that the request for prior authorization for BREZTRI  has been denied due to "Drug not covered/plan exclusion" this decision does not involve any determination of medical judgement. I tried to run a BIV on Trelegy as that is the only other 3 combo inhaler, that was also not covered under plan. I was able to run a BIV for ICS+LABA inhalers and the only one covered under this plan is Advair Diskus (brand only) for a $0 copay. However under the LAMA inhalers the plan states they will cover Pulmicort Flexhaler and Flovent HFA (may require prior auth).

## 2021-08-11 NOTE — Telephone Encounter (Signed)
Thank you for looking in to his coverage.   Pulmicort and flovent are ICS inhalers not LAMA.   What are his LAMA options? (This would be spiriva, incruse, etc.)  Thanks, Cody Ward

## 2021-08-16 ENCOUNTER — Other Ambulatory Visit (HOSPITAL_COMMUNITY): Payer: Self-pay

## 2021-08-16 MED ORDER — FLUTICASONE-SALMETEROL 500-50 MCG/ACT IN AEPB: 1.0000 | INHALATION_SPRAY | Freq: Two times a day (BID) | RESPIRATORY_TRACT | 11 refills | Status: DC

## 2021-08-16 NOTE — Addendum Note (Signed)
Addended by: Christen Butter on: 08/16/2021 01:54 PM   Modules accepted: Orders

## 2021-08-16 NOTE — Telephone Encounter (Signed)
I called and spoke with the pt and notified of response from pharm team and JD. The spiriva is already waiting at the pharmacy. I have sent rx for the advair. Nothing further needed.

## 2021-08-30 ENCOUNTER — Telehealth: Payer: Self-pay | Admitting: Pulmonary Disease

## 2021-08-30 DIAGNOSIS — J4550 Severe persistent asthma, uncomplicated: Secondary | ICD-10-CM

## 2021-08-30 NOTE — Telephone Encounter (Signed)
Called patient and he states that his insurance does not cover [place] or Dulera.   Can we run a ticket to see what is covered under his insurance and if anything is needing a PA to be done.   Thank you

## 2021-09-04 ENCOUNTER — Other Ambulatory Visit (HOSPITAL_COMMUNITY): Payer: Self-pay

## 2021-09-04 ENCOUNTER — Telehealth: Payer: Self-pay | Admitting: Pulmonary Disease

## 2021-09-04 NOTE — Telephone Encounter (Signed)
Called and left voicemail for patient to call office back in regards to pain in left lung

## 2021-09-04 NOTE — Telephone Encounter (Signed)
Patietn called and stated insurance will not cover Dulera.  Message from phamracy team is below: Unable to find any combination of coverage that will not require prior auth for this patient. Advair and Alvesco are the only two inhalers that are covered in the ICS/LABA/LAMA categories.   Most promising results would be to combine Advair Diskus (covered) + Spiriva (Handihaler and Respimat both require prior auth)   Alternate option would be to use Stiolto (prior auth) with Alvesco ($70) OR Pulmicort flexhaler (prior auth)        Please advise

## 2021-09-04 NOTE — Telephone Encounter (Signed)
Yes can we find out any all all coverage for patient so I can send a list of options back to Dr Francine Graven.   Thank you

## 2021-09-04 NOTE — Telephone Encounter (Signed)
Tried calling the pt and there was no answer- LMTCB.  

## 2021-09-05 ENCOUNTER — Telehealth: Payer: Self-pay

## 2021-09-05 ENCOUNTER — Other Ambulatory Visit (HOSPITAL_COMMUNITY): Payer: Self-pay

## 2021-09-05 NOTE — Telephone Encounter (Signed)
Per Dr Francine Graven,  He want sto start the PA for Advair and Spirivia.    Thank you   He states pt has prescriptions for both of those medications

## 2021-09-05 NOTE — Telephone Encounter (Signed)
Patient Advocate Encounter   Received notification that prior authorization for Spiriva Respimat 2.5MCG/ACT aerosol is required.   PA submitted on 09/05/2021 Key : [id number] Status is pending

## 2021-09-05 NOTE — Telephone Encounter (Signed)
He has prescriptions for Advair diskus 500-85mcg and Spiriva respimat 2.64mcg 2 puffs daily. If we need to apply for prior auth for the spiriva respimat then let's do that.  Thanks, JD

## 2021-09-06 NOTE — Telephone Encounter (Addendum)
Sharp pain in center of left side and in back.  Better than a couple of days ago.  Feels like it comes and goes.  When he has the pain it makes it hard to sleep.  Hurts when he exerts himself.  Yesterday did not bother most of the day.  Denies any cough.  Had a sneezing fit recently.  He vapes, he is cutting back, about to quit.  Scheduled for 09/21/21 at 11:30 am , advised to arrive by 11:25 am for check in.  Advised to call back for sooner appointment if symptoms worsen.  He verbalized understanding.  Nothing further needed.

## 2021-09-08 ENCOUNTER — Other Ambulatory Visit (HOSPITAL_COMMUNITY): Payer: Self-pay

## 2021-09-18 NOTE — Telephone Encounter (Signed)
Patient Advocate Encounter  Received a fax regarding Prior Authorization from [facility] for Spiriva Respimat 2.5.   Authorization has been DENIED due to drug not covered/plan exclusion. This medication is not eligible for prior authorization under this plan.  Burnell Blanks, CPhT Rx Patient Advocate Specialist Phone: [phone]

## 2021-09-19 ENCOUNTER — Ambulatory Visit (INDEPENDENT_AMBULATORY_CARE_PROVIDER_SITE_OTHER): Payer: PRIVATE HEALTH INSURANCE | Admitting: Pulmonary Disease

## 2021-09-19 ENCOUNTER — Encounter: Payer: Self-pay | Admitting: Pulmonary Disease

## 2021-09-19 ENCOUNTER — Other Ambulatory Visit: Payer: Self-pay

## 2021-09-19 ENCOUNTER — Ambulatory Visit (INDEPENDENT_AMBULATORY_CARE_PROVIDER_SITE_OTHER): Payer: PRIVATE HEALTH INSURANCE

## 2021-09-19 ENCOUNTER — Other Ambulatory Visit (HOSPITAL_COMMUNITY)
Admission: RE | Admit: 2021-09-19 | Discharge: 2021-09-19 | Disposition: A | Discharge status: Home / Self Care | Payer: PRIVATE HEALTH INSURANCE | Source: Ambulatory Visit | Attending: Pulmonary Disease | Admitting: Pulmonary Disease

## 2021-09-19 VITALS — BP 140/84 | HR 87 | Ht 69.5 in | Wt 308.6 lb

## 2021-09-19 DIAGNOSIS — J90 Pleural effusion, not elsewhere classified: Secondary | ICD-10-CM | POA: Insufficient documentation

## 2021-09-19 DIAGNOSIS — J4550 Severe persistent asthma, uncomplicated: Secondary | ICD-10-CM | POA: Diagnosis not present

## 2021-09-19 DIAGNOSIS — M2550 Pain in unspecified joint: Secondary | ICD-10-CM | POA: Diagnosis not present

## 2021-09-19 DIAGNOSIS — R079 Chest pain, unspecified: Secondary | ICD-10-CM

## 2021-09-19 LAB — BODY FLUID CELL COUNT WITH DIFFERENTIAL
Eos, Fluid: 63 %
Lymphs, Fluid: 24 %
Monocyte-Macrophage-Serous Fluid: 13 % — ABNORMAL LOW (ref 50–90)
Neutrophil Count, Fluid: 0 % (ref 0–25)
Total Nucleated Cell Count, Fluid: 1837 cu mm — ABNORMAL HIGH (ref 0–1000)

## 2021-09-19 LAB — LACTATE DEHYDROGENASE, PLEURAL OR PERITONEAL FLUID: LD, Fluid: 549 U/L — ABNORMAL HIGH (ref 3–23)

## 2021-09-19 LAB — C-REACTIVE PROTEIN: CRP: 4.4 mg/dL (ref 0.5–20.0)

## 2021-09-19 LAB — SEDIMENTATION RATE: Sed Rate: 45 mm/hr — ABNORMAL HIGH (ref 0–15)

## 2021-09-19 MED ORDER — ALBUTEROL SULFATE (2.5 MG/3ML) 0.083% IN NEBU: 2.5000 mg | INHALATION_SOLUTION | Freq: Four times a day (QID) | RESPIRATORY_TRACT | 3 refills | Status: DC | PRN

## 2021-09-19 MED ORDER — AMOXICILLIN-POT CLAVULANATE 875-125 MG PO TABS: 1.0000 | ORAL_TABLET | Freq: Two times a day (BID) | ORAL | 0 refills | Status: AC

## 2021-09-19 MED ORDER — FLUTICASONE-SALMETEROL 500-50 MCG/ACT IN AEPB: 1.0000 | INHALATION_SPRAY | Freq: Two times a day (BID) | RESPIRATORY_TRACT | 11 refills | Status: DC

## 2021-09-19 NOTE — Patient Instructions (Addendum)
Start augmentin 1 tab twice daily for 14 days  Once your labs return we may start you on steroids for inflammatory cause of the pleural effusion.   If you develop increasing chest pain or shortness of breath please go to the ER  Follow up in 2 weeks

## 2021-09-19 NOTE — Progress Notes (Signed)
Synopsis: Referred in April 2023 for Asthma by Dr. Delford Field  Subjective:   PATIENT ID: Cody Ward GENDER: male DOB: 15-Feb-1995, MRN: [id number]  HPI  Chief Complaint  Patient presents with   Follow-up    F/U on left sided chest pain   Cody Ward is a 26 year old male, daily vaper who returns to pulmonary clinic for an acute visit due to shortness of breath and left sided pain.   He started to have increased dyspnea, coughing fits, and right central lower chest discomfort that then wrapped around his back about 2 weeks ago. He does report intermittent chills. Denies fevers. He feels exhausted. Had sinus symptoms that went into his chest and then pain started. He also reports diffuse muscle pains and cramps. He also reports long standing joint stiffness/pain in hands that has been worse recently.   His mother has mixed connective tissue disease and RA. He reports his sister was recently diagnosed with an autoimmune disease.  Chest radiograph today shows right pleural effusion. He is ok with having thoracentesis performed.  OV 08/02/21 He has been out of his spiriva and dulera over the past couple of weeks and has noticed increased shortness of breath, cough and wheezing. He is using albuerol nebs and inhaler as needed. He received prednisone taper from urgent care 2 weeks ago which has helped.   He is vaping much less than before, about two to three times per day.   He started wellbutrin after last visit and is taking half tab per day. He did not tolerate the full tablet but reports his anxiety is much better.  PFTs today show mild obstruction, significant bronchodilator response and air trapping.   Initial Visit 06/01/21 He was admitted 1/14 to 1/16 for asthma exacerbation where he was seen by Dr. Marchelle Gearing in consult. Absolute eosinophil count was 2,000 on 1/14 and total IgE leve was 442. He has significant allergies to grasses based on allergy panel and mild allergies to pet dander.  He followed up with Rhunette Croft, NP in clinic on 1/19 after being discharged on dulera 200-46mcg 2 puffs twice daily and feeling much better at time of visit. He was completing a 15 day prednisone taper. He was prescribed singulair at that visit.  He was seen by Dr. Delford Field on 05/02/21. He never started singulair. His inhaler technique was appropriate and was given a spacer. He was treated with augmentin for 10 days for sinusitis and started on fluticasone nasal spray. He was started on pantoprazole for GERD.   He is a Production designer, theatre/television/film at [employer]. Vaping daily, he is vaping every 2 hours. He stopped smoking for vaping. Vaping for 2-4 years. Smoked cigarettes for 2 years before. He has cut back on the amount of vape he is using. He noticed an increase in his asthma symptoms over the past 2 years.  He reports increasing chest tightness, cough and intermittent wheezing since coming off the steroids. He has not picked up the montelukast yet.   He has history of childhood asthma.   Past Medical History:  Diagnosis Date   Asthma      Family History  Family history unknown: Yes     Social History   Socioeconomic History   Marital status: Single    Spouse name: Not on file   Number of children: Not on file   Years of education: Not on file   Highest education level: Not on file  Occupational History   Not on file  Tobacco  Use   Smoking status: Never    Passive exposure: Never   Smokeless tobacco: Never  Vaping Use   Vaping Use: Every day  Substance and Sexual Activity   Alcohol use: Not on file   Drug use: Not on file   Sexual activity: Not on file  Other Topics Concern   Not on file  Social History Narrative   Not on file   Social Determinants of Health   Financial Resource Strain: Not on file  Food Insecurity: Not on file  Transportation Needs: Not on file  Physical Activity: Not on file  Stress: Not on file  Social Connections: Not on file  Intimate Partner Violence: Not on file      Allergies  Allergen Reactions   Grass Pollen([id number]-Swt Vern) Cough    Rash and cough     Outpatient Medications Prior to Visit  Medication Sig Dispense Refill   albuterol (VENTOLIN HFA) 108 (90 Base) MCG/ACT inhaler Inhale 2 puffs into the lungs every 6 (six) hours as needed for wheezing or shortness of breath. 8.5 g 5   Albuterol Sulfate (PROAIR RESPICLICK) 108 (90 Base) MCG/ACT AEPB Inhale 1-2 puffs into the lungs every 6 (six) hours as needed. 1 each 5   buPROPion (WELLBUTRIN SR) 150 MG 12 hr tablet Take 1 tablet (150 mg total) by mouth 2 (two) times daily. Take 1 tablet daily for the first 7 days, then 1 tablet twice daily there after 60 tablet 4   fluticasone (FLONASE) 50 MCG/ACT nasal spray Place 1 spray into both nostrils daily. 16 g 2   magnesium 30 MG tablet Take 30 mg by mouth every evening.     montelukast (SINGULAIR) 10 MG tablet Take 1 tablet (10 mg total) by mouth at bedtime. 30 tablet 3   riboflavin (VITAMIN B-2) 100 MG TABS tablet Take 100 mg by mouth every evening.     Spacer/Aero-Holding Chambers (AEROCHAMBER MV) inhaler Use as instructed 1 each 0   albuterol (PROVENTIL) (2.5 MG/3ML) 0.083% nebulizer solution Take 3 mLs (2.5 mg total) by nebulization every 6 (six) hours as needed for wheezing or shortness of breath. 90 mL 3   fluticasone-salmeterol (ADVAIR DISKUS) 500-50 MCG/ACT AEPB Inhale 1 puff into the lungs in the morning and at bedtime. 60 each 11   Tiotropium Bromide Monohydrate (SPIRIVA RESPIMAT) 2.5 MCG/ACT AERS Inhale 2 puffs into the lungs daily. (Patient not taking: Reported on 08/02/2021) 4 g 11   No facility-administered medications prior to visit.   Review of Systems  Constitutional:  Positive for malaise/fatigue. Negative for chills, fever and weight loss.  HENT:  Positive for congestion. Negative for sinus pain and sore throat.   Eyes: Negative.   Respiratory:  Positive for cough and wheezing. Negative for hemoptysis, sputum production and  shortness of breath.   Cardiovascular:  Positive for chest pain. Negative for palpitations, orthopnea, claudication and leg swelling.  Gastrointestinal:  Negative for abdominal pain, heartburn, nausea and vomiting.  Genitourinary: Negative.   Musculoskeletal:  Positive for back pain and joint pain. Negative for myalgias.  Skin:  Negative for rash.  Neurological:  Negative for weakness.  Endo/Heme/Allergies:  Positive for environmental allergies.  Psychiatric/Behavioral: Negative.     Objective:   Vitals:   09/19/21 1138  BP: (!) 140/84  Pulse: 87  SpO2: 97%  Weight: (!) 308 lb 9.6 oz (140 kg)  Height: 5' 9.5" (1.765 m)   Physical Exam Constitutional:      General: He is not in acute distress.  Appearance: He is obese.  HENT:     Head: Normocephalic and atraumatic.  Eyes:     Conjunctiva/sclera: Conjunctivae normal.  Cardiovascular:     Rate and Rhythm: Normal rate and regular rhythm.     Pulses: Normal pulses.     Heart sounds: Normal heart sounds. No murmur heard. Pulmonary:     Effort: Pulmonary effort is normal.     Breath sounds: Examination of the right-middle field reveals decreased breath sounds. Examination of the right-lower field reveals decreased breath sounds. Decreased breath sounds present. No wheezing, rhonchi or rales.  Musculoskeletal:     Right lower leg: No edema.     Left lower leg: No edema.  Skin:    General: Skin is warm and dry.  Neurological:     General: No focal deficit present.     Mental Status: He is alert.  Psychiatric:        Mood and Affect: Mood normal.        Behavior: Behavior normal.        Thought Content: Thought content normal.        Judgment: Judgment normal.    CBC    Component Value Date/Time   WBC 16.2 (H) 02/27/2021 0544   RBC 5.20 02/27/2021 0544   HGB 14.2 02/27/2021 0544   HCT 43.8 02/27/2021 0544   PLT 214 02/27/2021 0544   MCV 84.2 02/27/2021 0544   MCH 27.3 02/27/2021 0544   MCHC 32.4 02/27/2021 0544    RDW 13.7 02/27/2021 0544   LYMPHSABS 1.1 02/27/2021 0544   MONOABS 1.4 (H) 02/27/2021 0544   EOSABS 0.0 02/27/2021 0544   BASOSABS 0.0 02/27/2021 0544      Latest Ref Rng & Units 02/27/2021    5:44 AM 02/26/2021    4:57 AM 02/25/2021   11:18 AM  BMP  Glucose 70 - 99 mg/dL 599  357  017   BUN 6 - 20 mg/dL 17  12  14    Creatinine 0.61 - 1.24 mg/dL  7.93  9.03   Sodium 135 - 145 mmol/L 138  138  135   Potassium 3.5 - 5.1 mmol/L 4.0  4.3  4.0   Chloride 98 - 111 mmol/L 105  107  105   CO2 22 - 32 mmol/L 25  23  25    Calcium 8.9 - 10.3 mg/dL 9.0  9.7  9.1    Chest imaging: CT Chest 02/26/21 1. Although slightly limited by patient respiratory motion, there is no evidence of clinically significant central, lobar or segmental sized pulmonary embolism. 2. Patchy areas of ground-glass attenuation are noted in the right middle lobe and left lower lobe, likely of infectious or inflammatory etiology.  PFT:    Latest Ref Rng & Units 08/02/2021   11:00 AM  PFT Results  FVC-Pre L 4.70   FVC-Predicted Pre % 86   FVC-Post L 5.04   FVC-Predicted Post % 92   Pre FEV1/FVC % % 65   Post FEV1/FCV % % 71   FEV1-Pre L 3.07   FEV1-Predicted Pre % 68   FEV1-Post L 3.57   DLCO uncorrected ml/min/mmHg 35.91   DLCO UNC% % 110   DLCO corrected ml/min/mmHg 35.91   DLCO COR %Predicted % 110   DLVA Predicted % 108   TLC L 7.20   TLC % Predicted % 106   RV % Predicted % 134   PFTs 2023: Mild obstruction, significant bronchodilator response, air trapping Labs:  Path:  Echo:  Heart Catheterization:  Assessment & Plan:   Pleural effusion on right - Plan: ANA, ANCA screen with reflex titer, Anti-DNA antibody, double-stranded, Anti-Smith antibody, Centromere Antibodies, Cyclic citrul peptide antibody, IgG, Rheumatoid factor, RNP Antibodies, Sedimentation rate, Sjogren's syndrome antibods(ssa + ssb), C-reactive protein, Amylase, Body Fluid (other), ALBUMIN, FLUID (Other), Glucose, pleural or  peritoneal fluid, Protein, body fluid (other), Cholesterol, body fluid, Lactate dehydrogenase (pleural or peritoneal fluid), Body fluid cell count with differential, Cytology - Non PAP;, Body fluid culture, Gram stain, pH, body fluid, Triglycerides, DG Chest 1 View  Arthralgia, unspecified joint - Plan: ANA, ANCA screen with reflex titer, Anti-DNA antibody, double-stranded, Anti-Smith antibody, Centromere Antibodies, Cyclic citrul peptide antibody, IgG, Rheumatoid factor, RNP Antibodies, Sedimentation rate, Sjogren's syndrome antibods(ssa + ssb), C-reactive protein  Severe persistent asthma without complication - Plan: albuterol (PROVENTIL) (2.5 MG/3ML) 0.083% nebulizer solution, fluticasone-salmeterol (ADVAIR DISKUS) 500-50 MCG/ACT AEPB  Discussion: Cody Ward is a 26 year old male, daily vaper who returns to pulmonary clinic for acute visit of chest pain.   He has right sided pleural effusion that is acute. Differential of pleural effusion includes infectious vs inflammatory and possibly malignant.   Thoracentesis performed today with 1L drained, fluid was bloody appearing. Post-procedure chest x-ray is without pneumothorax and reduction in effusion with small effusion remaining.   Pleural fluid studies sent. Autoimmune panel ordered.   He will be quitting vaping soon. He has cut back significantly.   He is to continue advair diskus inhaler. It has been reported that the prior auth for his spiriva has been denied.  Follow up in 2 weeks  Melody Comas, MD [facility] Office: [phone]   Current Outpatient Medications:    albuterol (VENTOLIN HFA) 108 (90 Base) MCG/ACT inhaler, Inhale 2 puffs into the lungs every 6 (six) hours as needed for wheezing or shortness of breath., Disp: 8.5 g, Rfl: 5   Albuterol Sulfate (PROAIR RESPICLICK) 108 (90 Base) MCG/ACT AEPB, Inhale 1-2 puffs into the lungs every 6 (six) hours as needed., Disp: 1 each, Rfl: 5   buPROPion  (WELLBUTRIN SR) 150 MG 12 hr tablet, Take 1 tablet (150 mg total) by mouth 2 (two) times daily. Take 1 tablet daily for the first 7 days, then 1 tablet twice daily there after, Disp: 60 tablet, Rfl: 4   fluticasone (FLONASE) 50 MCG/ACT nasal spray, Place 1 spray into both nostrils daily., Disp: 16 g, Rfl: 2   fluticasone-salmeterol (ADVAIR DISKUS) 500-50 MCG/ACT AEPB, Inhale 1 puff into the lungs in the morning and at bedtime., Disp: 60 each, Rfl: 11   magnesium 30 MG tablet, Take 30 mg by mouth every evening., Disp: , Rfl:    montelukast (SINGULAIR) 10 MG tablet, Take 1 tablet (10 mg total) by mouth at bedtime., Disp: 30 tablet, Rfl: 3   riboflavin (VITAMIN B-2) 100 MG TABS tablet, Take 100 mg by mouth every evening., Disp: , Rfl:    Spacer/Aero-Holding Chambers (AEROCHAMBER MV) inhaler, Use as instructed, Disp: 1 each, Rfl: 0   albuterol (PROVENTIL) (2.5 MG/3ML) 0.083% nebulizer solution, Take 3 mLs (2.5 mg total) by nebulization every 6 (six) hours as needed for wheezing or shortness of breath., Disp: 90 mL, Rfl: 3

## 2021-09-19 NOTE — Progress Notes (Signed)
Thoracentesis  Procedure Note  Cody Ward  [id number]  09-Dec-1995  Date:09/19/21  Time:1:00 PM   Provider Performing:Claiborne Stroble B Berle Fitz   Procedure: Thoracentesis with imaging guidance (79150)  Indication(s) Pleural Effusion  Consent Risks of the procedure as well as the alternatives and risks of each were explained to the patient and/or caregiver.  Consent for the procedure was obtained and is signed in the bedside chart  Anesthesia Topical only with 1% lidocaine    Time Out Verified patient identification, verified procedure, site/side was marked, verified correct patient position, special equipment/implants available, medications/allergies/relevant history reviewed, required imaging and test results available.   Sterile Technique Maximal sterile technique including full sterile barrier drape, hand hygiene, sterile gown, sterile gloves, mask, hair covering, sterile ultrasound probe cover (if used).  Procedure Description Ultrasound was used to identify appropriate pleural anatomy for placement and overlying skin marked.  Area of drainage cleaned and draped in sterile fashion. Lidocaine was used to anesthetize the skin and subcutaneous tissue.  1000 cc's of bloody appearing fluid was drained from the right pleural space. Catheter then removed and bandaid applied to site.   Complications/Tolerance None; patient tolerated the procedure well. Chest X-ray is ordered to confirm no post-procedural complication.   EBL Minimal   Specimen(s) Pleural fluid

## 2021-09-20 LAB — PROTEIN, BODY FLUID (OTHER): Protein, Fluid: 4.8 g/dL

## 2021-09-20 LAB — RNP ANTIBODIES: ENA RNP Ab: <0.2 AI (ref 0.0–0.9)

## 2021-09-20 LAB — GLUCOSE, BODY FLUID OTHER: Glucose, Fluid: 119 mg/dL

## 2021-09-20 LAB — ALBUMIN, FLUID (OTHER): Albumin, Body Fluid Other: 3.1 g/dL

## 2021-09-20 LAB — CYTOLOGY - NON PAP

## 2021-09-21 LAB — TRIGLYCERIDES: Triglycerides: 51 mg/dL (ref 0–149)

## 2021-09-21 LAB — SPECIMEN STATUS REPORT

## 2021-09-23 LAB — CHOLESTEROL, BODY FLUID: Cholesterol, Fluid: 91 mg/dL

## 2021-09-23 LAB — TRIGLYCERIDES

## 2021-09-24 LAB — BODY FLUID CULTURE

## 2021-09-25 ENCOUNTER — Telehealth: Payer: Self-pay | Admitting: Pulmonary Disease

## 2021-09-25 LAB — RHEUMATOID FACTOR: Rheumatoid fact SerPl-aCnc: <14 IU/mL (ref <14)

## 2021-09-25 LAB — ANCA SCREEN W REFLEX TITER: ANCA SCREEN: NEGATIVE

## 2021-09-25 LAB — ANTI-SMITH ANTIBODY: ENA SM Ab Ser-aCnc: <1 AI

## 2021-09-25 LAB — CYCLIC CITRUL PEPTIDE ANTIBODY, IGG: Cyclic Citrullin Peptide Ab: <16 UNITS

## 2021-09-25 LAB — ANA: Anti Nuclear Antibody (ANA): NEGATIVE

## 2021-09-25 LAB — CENTROMERE ANTIBODIES: Centromere Ab Screen: <1 AI

## 2021-09-25 LAB — SJOGREN'S SYNDROME ANTIBODS(SSA + SSB)
SSA (Ro) (ENA) Antibody, IgG: <1 AI
SSB (La) (ENA) Antibody, IgG: <1 AI

## 2021-09-25 LAB — ANTI-DNA ANTIBODY, DOUBLE-STRANDED: ds DNA Ab: <1 IU/mL

## 2021-09-25 NOTE — Telephone Encounter (Signed)
Please let patient know that his pleural fluid studies were negative for cancer. It appears the body fluid culture has finalized but I am unable to see this result in Epic, was there a report faxed to use that is in my folder?  The cell count looked very inflamed concerning more for infection or inflammation. His autoimmune workup is negative, which is good.  If he is continuing to have shortness of breath and right sided chest discomfort we can try a course of prednisone to help with the inflammation. I would recommend 40mg  prednisone daily for 5 days, 20mg  for 5 days, then 10mg  daily for 5 days. If he has improved significantly with thoracentesis and antibiotic alone, we can hold off on steroids.  Thanks, JD

## 2021-09-26 MED ORDER — PREDNISONE 10 MG PO TABS: ORAL_TABLET | ORAL | 0 refills | Status: AC

## 2021-09-26 NOTE — Telephone Encounter (Signed)
Called and spoke to patient this morning about his results for is thoracentesis. Patient verbalized understanding of the results. Patient did state that he did have a little bit of shortness of breath and some discomfort of the right side. So I did go over the medication and the dosage of the prednisone. Patient did verbalize understanding. Nothing further needed

## 2021-10-10 LAB — AMYLASE, BODY FLUID (OTHER)

## 2021-10-11 ENCOUNTER — Encounter: Payer: Self-pay | Admitting: Pulmonary Disease

## 2021-10-11 ENCOUNTER — Ambulatory Visit (INDEPENDENT_AMBULATORY_CARE_PROVIDER_SITE_OTHER): Payer: PRIVATE HEALTH INSURANCE

## 2021-10-11 ENCOUNTER — Ambulatory Visit (INDEPENDENT_AMBULATORY_CARE_PROVIDER_SITE_OTHER): Payer: PRIVATE HEALTH INSURANCE | Admitting: Pulmonary Disease

## 2021-10-11 VITALS — BP 124/74 | HR 88 | Ht 70.0 in | Wt 312.2 lb

## 2021-10-11 DIAGNOSIS — J4550 Severe persistent asthma, uncomplicated: Secondary | ICD-10-CM | POA: Diagnosis not present

## 2021-10-11 DIAGNOSIS — J90 Pleural effusion, not elsewhere classified: Secondary | ICD-10-CM

## 2021-10-11 NOTE — Progress Notes (Signed)
Synopsis: Referred in April 2023 for Asthma by Dr. Delford Field  Subjective:   PATIENT ID: Cody Ward GENDER: male DOB: July 19, 1995, MRN: [id number]  HPI  Chief Complaint  Patient presents with   Follow-up    Pt is feeling some better, is having a small flare today. The inhaler and steroids are helping him and he states "he's better than 2 weeks ago"   Cody Ward is a 26 year old male, daily vaper who returns to pulmonary clinic for follow up of right pleural effusion.   He was treated with augmentin and prednisone taper. He is currently on 20mg  per day. He is using advair 500-35mcg 1 puff twice daily. His chest pain and breathing are much improved but he does have limited feeling on right on deep inspiration. He has not required his rescue inhaler over the past couple of weeks. He has quit vaping since last visit.   Chest radiograph today shows small residual right pleural effusion  OV 09/19/21 He started to have increased dyspnea, coughing fits, and right central lower chest discomfort that then wrapped around his back about 2 weeks ago. He does report intermittent chills. Denies fevers. He feels exhausted. Had sinus symptoms that went into his chest and then pain started. He also reports diffuse muscle pains and cramps. He also reports long standing joint stiffness/pain in hands that has been worse recently.   His mother has mixed connective tissue disease and RA. He reports his sister was recently diagnosed with an autoimmune disease.  Chest radiograph today shows right pleural effusion. He is ok with having thoracentesis performed.  OV 08/02/21 He has been out of his spiriva and dulera over the past couple of weeks and has noticed increased shortness of breath, cough and wheezing. He is using albuerol nebs and inhaler as needed. He received prednisone taper from urgent care 2 weeks ago which has helped.   He is vaping much less than before, about two to three times per day.   He  started wellbutrin after last visit and is taking half tab per day. He did not tolerate the full tablet but reports his anxiety is much better.  PFTs today show mild obstruction, significant bronchodilator response and air trapping.   Initial Visit 06/01/21 He was admitted 1/14 to 1/16 for asthma exacerbation where he was seen by Dr. 2/16 in consult. Absolute eosinophil count was 2,000 on 1/14 and total IgE leve was 442. He has significant allergies to grasses based on allergy panel and mild allergies to pet dander. He followed up with 2/14, NP in clinic on 1/19 after being discharged on dulera 200-30mcg 2 puffs twice daily and feeling much better at time of visit. He was completing a 15 day prednisone taper. He was prescribed singulair at that visit.  He was seen by Dr. 4m on 05/02/21. He never started singulair. His inhaler technique was appropriate and was given a spacer. He was treated with augmentin for 10 days for sinusitis and started on fluticasone nasal spray. He was started on pantoprazole for GERD.   He is a 05/04/21 at [employer], [employer]. Vaping daily, he is vaping every 2 hours. He stopped smoking for vaping. Vaping for 2-4 years. Smoked cigarettes for 2 years before. He has cut back on the amount of vape he is using. He noticed an increase in his asthma symptoms over the past 2 years.  He reports increasing chest tightness, cough and intermittent wheezing since coming off the steroids. He has not picked  up the montelukast yet.   He has history of childhood asthma.   Past Medical History:  Diagnosis Date   Asthma      Family History  Family history unknown: Yes     Social History   Socioeconomic History   Marital status: Single    Spouse name: Not on file   Number of children: Not on file   Years of education: Not on file   Highest education level: Not on file  Occupational History   Not on file  Tobacco Use   Smoking status: Never    Passive exposure: Never    Smokeless tobacco: Never  Vaping Use   Vaping Use: Every day  Substance and Sexual Activity   Alcohol use: Not on file   Drug use: Not on file   Sexual activity: Not on file  Other Topics Concern   Not on file  Social History Narrative   Not on file   Social Determinants of Health   Financial Resource Strain: Not on file  Food Insecurity: Not on file  Transportation Needs: Not on file  Physical Activity: Not on file  Stress: Not on file  Social Connections: Not on file  Intimate Partner Violence: Not on file     Allergies  Allergen Reactions   Grass Pollen(K-O-[id number]-T-Swt Vern) Cough    Rash and cough     Outpatient Medications Prior to Visit  Medication Sig Dispense Refill   albuterol (PROVENTIL) (2.5 MG/3ML) 0.083% nebulizer solution Take 3 mLs (2.5 mg total) by nebulization every 6 (six) hours as needed for wheezing or shortness of breath. 90 mL 3   albuterol (VENTOLIN HFA) 108 (90 Base) MCG/ACT inhaler Inhale 2 puffs into the lungs every 6 (six) hours as needed for wheezing or shortness of breath. 8.5 g 5   buPROPion (WELLBUTRIN SR) 150 MG 12 hr tablet Take 1 tablet (150 mg total) by mouth 2 (two) times daily. Take 1 tablet daily for the first 7 days, then 1 tablet twice daily there after 60 tablet 4   fluticasone (FLONASE) 50 MCG/ACT nasal spray Place 1 spray into both nostrils daily. 16 g 2   fluticasone-salmeterol (ADVAIR DISKUS) 500-50 MCG/ACT AEPB Inhale 1 puff into the lungs in the morning and at bedtime. 60 each 11   magnesium 30 MG tablet Take 30 mg by mouth every evening.     montelukast (SINGULAIR) 10 MG tablet Take 1 tablet (10 mg total) by mouth at bedtime. 30 tablet 3   predniSONE (DELTASONE) 10 MG tablet Take 4 tablets (40 mg total) by mouth daily with breakfast for 5 days, THEN 3 tablets (30 mg total) daily with breakfast for 5 days, THEN 2 tablets (20 mg total) daily with breakfast for 5 days, THEN 1 tablet (10 mg total) daily with breakfast for 5 days. 50 tablet  0   Albuterol Sulfate (PROAIR RESPICLICK) 108 (90 Base) MCG/ACT AEPB Inhale 1-2 puffs into the lungs every 6 (six) hours as needed. (Patient not taking: Reported on 10/11/2021) 1 each 5   riboflavin (VITAMIN B-2) 100 MG TABS tablet Take 100 mg by mouth every evening. (Patient not taking: Reported on 10/11/2021)     Spacer/Aero-Holding Chambers (AEROCHAMBER MV) inhaler Use as instructed (Patient not taking: Reported on 10/11/2021) 1 each 0   No facility-administered medications prior to visit.   Review of Systems  Constitutional:  Positive for malaise/fatigue. Negative for chills, fever and weight loss.  HENT:  Negative for congestion, sinus pain and sore throat.  Eyes: Negative.   Respiratory:  Negative for cough, hemoptysis, sputum production, shortness of breath and wheezing.   Cardiovascular:  Negative for chest pain, palpitations, orthopnea, claudication and leg swelling.  Gastrointestinal:  Negative for abdominal pain, heartburn, nausea and vomiting.  Genitourinary: Negative.   Musculoskeletal:  Negative for myalgias.  Skin:  Negative for rash.  Neurological:  Negative for weakness.  Psychiatric/Behavioral: Negative.     Objective:   Vitals:   10/11/21 1118  BP: 124/74  Pulse: 88  SpO2: 98%  Weight: (!) 312 lb 3.2 oz (141.6 kg)  Height: 5\' 10"  (1.778 m)   Physical Exam Constitutional:      General: He is not in acute distress.    Appearance: He is obese.  HENT:     Head: Normocephalic and atraumatic.  Cardiovascular:     Rate and Rhythm: Normal rate and regular rhythm.     Pulses: Normal pulses.     Heart sounds: Normal heart sounds. No murmur heard. Pulmonary:     Effort: Pulmonary effort is normal.     Breath sounds: No wheezing, rhonchi or rales.  Musculoskeletal:     Right lower leg: No edema.     Left lower leg: No edema.  Skin:    General: Skin is warm and dry.  Neurological:     General: No focal deficit present.     Mental Status: He is alert.   Psychiatric:        Mood and Affect: Mood normal.        Behavior: Behavior normal.        Thought Content: Thought content normal.        Judgment: Judgment normal.    CBC    Component Value Date/Time   WBC 16.2 (H) 02/27/2021 0544   RBC 5.20 02/27/2021 0544   HGB 14.2 02/27/2021 0544   HCT 43.8 02/27/2021 0544   PLT 214 02/27/2021 0544   MCV 84.2 02/27/2021 0544   MCH 27.3 02/27/2021 0544   MCHC 32.4 02/27/2021 0544   RDW 13.7 02/27/2021 0544   LYMPHSABS 1.1 02/27/2021 0544   MONOABS 1.4 (H) 02/27/2021 0544   EOSABS 0.0 02/27/2021 0544   BASOSABS 0.0 02/27/2021 0544      Latest Ref Rng & Units 02/27/2021    5:44 AM 02/26/2021    4:57 AM 02/25/2021   11:18 AM  BMP  Glucose 70 - 99 mg/dL 02/27/2021  283  151   BUN 6 - 20 mg/dL 17  12  14    Creatinine 0.61 - 1.24 mg/dL 761   6.07   Sodium 135 - 145 mmol/L 138  138  135   Potassium 3.5 - 5.1 mmol/L 4.0  4.3  4.0   Chloride 98 - 111 mmol/L 105  107  105   CO2 22 - 32 mmol/L 25  23  25    Calcium 8.9 - 10.3 mg/dL 9.0  9.7  9.1    Chest imaging: CXR 09/19/21 Decreased right pleural effusion and improved aeration within the right lung base. No pneumothorax.  There is interval appearance of moderate sized right pleural effusion. Possibility of underlying atelectasis/pneumonia in right lower lung field is not excluded.  CT Chest 02/26/21 1. Although slightly limited by patient respiratory motion, there is no evidence of clinically significant central, lobar or segmental sized pulmonary embolism. 2. Patchy areas of ground-glass attenuation are noted in the right middle lobe and left lower lobe, likely of infectious or inflammatory etiology.  PFT:  Latest Ref Rng & Units 08/02/2021   11:00 AM  PFT Results  FVC-Pre L 4.70   FVC-Predicted Pre % 86   FVC-Post L 5.04   FVC-Predicted Post % 92   Pre FEV1/FVC % % 65   Post FEV1/FCV % % 71   FEV1-Pre L 3.07   FEV1-Predicted Pre % 68   FEV1-Post L 3.57   DLCO  uncorrected ml/min/mmHg 35.91   DLCO UNC% % 110   DLCO corrected ml/min/mmHg 35.91   DLCO COR %Predicted % 110   DLVA Predicted % 108   TLC L 7.20   TLC % Predicted % 106   RV % Predicted % 134   PFTs 2023: Mild obstruction, significant bronchodilator response, air trapping Labs:  Path:  Echo:  Heart Catheterization:  Assessment & Plan:   Severe persistent asthma without complication  Pleural effusion on right - Plan: DG Chest 2 View  Discussion: Cody Ward is a 26 year old male, daily vaper who returns to pulmonary clinic for follow up of right pleural effusion.   He has exudative effusion based on pleural fluid studies with eosinophilic predominance. Cultures are negative and cytology is negative for malignancy.  His autoimmune workup is unrevealing at this time. He has been treated with augmentin and steroid taper.   The etiology of the exudative effusion includes infectious vs inflammatory.  Follow up x-ray shows persistent small right effusion. We will continue to monitor the effusion at this time.  Complete steroid taper as planned. Call if symptoms return.  Follow up in 3 months.   Melody Comas, MD [facility] Office: [phone]   Current Outpatient Medications:    albuterol (PROVENTIL) (2.5 MG/3ML) 0.083% nebulizer solution, Take 3 mLs (2.5 mg total) by nebulization every 6 (six) hours as needed for wheezing or shortness of breath., Disp: 90 mL, Rfl: 3   albuterol (VENTOLIN HFA) 108 (90 Base) MCG/ACT inhaler, Inhale 2 puffs into the lungs every 6 (six) hours as needed for wheezing or shortness of breath., Disp: 8.5 g, Rfl: 5   buPROPion (WELLBUTRIN SR) 150 MG 12 hr tablet, Take 1 tablet (150 mg total) by mouth 2 (two) times daily. Take 1 tablet daily for the first 7 days, then 1 tablet twice daily there after, Disp: 60 tablet, Rfl: 4   fluticasone (FLONASE) 50 MCG/ACT nasal spray, Place 1 spray into both nostrils daily., Disp: 16 g,  Rfl: 2   fluticasone-salmeterol (ADVAIR DISKUS) 500-50 MCG/ACT AEPB, Inhale 1 puff into the lungs in the morning and at bedtime., Disp: 60 each, Rfl: 11   magnesium 30 MG tablet, Take 30 mg by mouth every evening., Disp: , Rfl:    montelukast (SINGULAIR) 10 MG tablet, Take 1 tablet (10 mg total) by mouth at bedtime., Disp: 30 tablet, Rfl: 3   predniSONE (DELTASONE) 10 MG tablet, Take 4 tablets (40 mg total) by mouth daily with breakfast for 5 days, THEN 3 tablets (30 mg total) daily with breakfast for 5 days, THEN 2 tablets (20 mg total) daily with breakfast for 5 days, THEN 1 tablet (10 mg total) daily with breakfast for 5 days., Disp: 50 tablet, Rfl: 0   Albuterol Sulfate (PROAIR RESPICLICK) 108 (90 Base) MCG/ACT AEPB, Inhale 1-2 puffs into the lungs every 6 (six) hours as needed. (Patient not taking: Reported on 10/11/2021), Disp: 1 each, Rfl: 5   riboflavin (VITAMIN B-2) 100 MG TABS tablet, Take 100 mg by mouth every evening. (Patient not taking: Reported on 10/11/2021), Disp: ,  Rfl:

## 2021-10-11 NOTE — Patient Instructions (Addendum)
Continue advair 500-37mcg 1 puff twice daily - rinse mouth out after each use  Continue albuterol inhaler as needed  Complete steroid taper as planned.   Your pleural fluid studies appear to be from a possible infection  We will repeat a chest x-ray today  Follow up in 3 months, call sooner if your symptoms worsen

## 2021-10-12 ENCOUNTER — Telehealth: Payer: Self-pay | Admitting: Pulmonary Disease

## 2021-10-12 DIAGNOSIS — J90 Pleural effusion, not elsewhere classified: Secondary | ICD-10-CM

## 2021-10-12 NOTE — Telephone Encounter (Signed)
Please let patient know he continues to have small amount of fluid around the bottom of his right lung which is likely causing his symptoms of not getting a full breath on the right.   I'd recommend the following: Check CT Chest scan with contrast to further evaluate the pleural effusion And then reconsider repeat thoracentesis with ultrasound  Or  We continue to monitor for improvement and repeat a Chest radiograph in 1 month  Thanks, JD

## 2021-10-17 NOTE — Telephone Encounter (Signed)
Called and left patient a voicemail to call office in regards to recommendations from Dr Francine Graven

## 2021-10-19 NOTE — Telephone Encounter (Signed)
ATC patient, LVM to return call. 

## 2021-10-20 NOTE — Addendum Note (Signed)
Addended by: Dorisann Frames R on: 10/20/2021 03:26 PM   Modules accepted: Orders

## 2021-10-20 NOTE — Telephone Encounter (Signed)
Spoke with pt and he would like to go ahead with the CT and order was placed. Pt stated understanding. Nothing further needed at at this time.

## 2021-10-20 NOTE — Telephone Encounter (Signed)
Patient is returning  phone call. Patient phone number is [phone].

## 2021-10-20 NOTE — Telephone Encounter (Signed)
ATC LVMTCB x 1  

## 2021-10-30 ENCOUNTER — Ambulatory Visit (HOSPITAL_BASED_OUTPATIENT_CLINIC_OR_DEPARTMENT_OTHER): Admission: RE | Admit: 2021-10-30 | Payer: PRIVATE HEALTH INSURANCE | Source: Ambulatory Visit

## 2021-11-06 ENCOUNTER — Ambulatory Visit (HOSPITAL_BASED_OUTPATIENT_CLINIC_OR_DEPARTMENT_OTHER)
Admission: RE | Admit: 2021-11-06 | Discharge: 2021-11-06 | Disposition: A | Discharge status: Home / Self Care | Payer: BC Managed Care – PPO | Source: Ambulatory Visit | Attending: Pulmonary Disease | Admitting: Pulmonary Disease

## 2021-11-06 ENCOUNTER — Encounter (HOSPITAL_BASED_OUTPATIENT_CLINIC_OR_DEPARTMENT_OTHER): Payer: Self-pay

## 2021-11-06 DIAGNOSIS — J90 Pleural effusion, not elsewhere classified: Secondary | ICD-10-CM | POA: Diagnosis not present

## 2021-11-06 MED ORDER — IOHEXOL 300 MG/ML  SOLN
100.0000 mL | Freq: Once | INTRAMUSCULAR | Status: AC | PRN
  Administered 2021-11-06: 75 mL via INTRAVENOUS

## 2021-11-23 ENCOUNTER — Telehealth: Payer: Self-pay | Admitting: Pulmonary Disease

## 2021-11-23 DIAGNOSIS — J4550 Severe persistent asthma, uncomplicated: Secondary | ICD-10-CM

## 2021-11-23 MED ORDER — FLUTICASONE-SALMETEROL 500-50 MCG/ACT IN AEPB: 1.0000 | INHALATION_SPRAY | Freq: Two times a day (BID) | RESPIRATORY_TRACT | 5 refills | Status: DC

## 2021-11-23 NOTE — Telephone Encounter (Signed)
The pleural effusion (fluid around the right lung) has mostly cleared. There is a small area of thickening of the lining of the lung which we will monitor in the future, overall no plans for further intervention or treatment at this time.  Written by Freddi Starr, MD on 11/11/2021  6:01 PM EDT  Called and spoke with patient. I went over the above results and he verbalized understanding. He also stated that he was in need of a refill on his Advair to be sent to [facility] in [place]. Advised him that I would go ahead and send this in for him.   Nothing further needed at time of call.

## 2022-01-10 ENCOUNTER — Ambulatory Visit: Payer: PRIVATE HEALTH INSURANCE | Admitting: Pulmonary Disease

## 2022-01-19 ENCOUNTER — Ambulatory Visit: Payer: PRIVATE HEALTH INSURANCE | Admitting: Pulmonary Disease

## 2022-01-19 NOTE — Progress Notes (Deleted)
Synopsis: Referred in April 2023 for Asthma by Dr. Delford FieldWright  Subjective:   PATIENT ID: Cody Ward GENDER: male DOB: Jan 16, 1996, MRN: [id number]  HPI  No chief complaint on file.  Cody Ward is a 26 year old male, daily vaper who returns to pulmonary clinic for follow up of right pleural effusion and asthma.     OV 09/19/21 He was treated with augmentin and prednisone taper. He is currently on 20mg  per day. He is using advair 500-3850mcg 1 puff twice daily. His chest pain and breathing are much improved but he does have limited feeling on right on deep inspiration. He has not required his rescue inhaler over the past couple of weeks. He has quit vaping since last visit.   Chest radiograph today shows small residual right pleural effusion  OV 09/19/21 He started to have increased dyspnea, coughing fits, and right central lower chest discomfort that then wrapped around his back about 2 weeks ago. He does report intermittent chills. Denies fevers. He feels exhausted. Had sinus symptoms that went into his chest and then pain started. He also reports diffuse muscle pains and cramps. He also reports long standing joint stiffness/pain in hands that has been worse recently.   His mother has mixed connective tissue disease and RA. He reports his sister was recently diagnosed with an autoimmune disease.  Chest radiograph today shows right pleural effusion. He is ok with having thoracentesis performed.  OV 08/02/21 He has been out of his spiriva and dulera over the past couple of weeks and has noticed increased shortness of breath, cough and wheezing. He is using albuerol nebs and inhaler as needed. He received prednisone taper from urgent care 2 weeks ago which has helped.   He is vaping much less than before, about two to three times per day.   He started wellbutrin after last visit and is taking half tab per day. He did not tolerate the full tablet but reports his anxiety is much better.  PFTs  today show mild obstruction, significant bronchodilator response and air trapping.   Initial Visit 06/01/21 He was admitted 1/14 to 1/16 for asthma exacerbation where he was seen by Dr. Marchelle Gearingamaswamy in consult. Absolute eosinophil count was 2,000 on 1/14 and total IgE leve was 442. He has significant allergies to grasses based on allergy panel and mild allergies to pet dander. He followed up with Rhunette CroftKatie Cobb, NP in clinic on 1/19 after being discharged on dulera 200-745mcg 2 puffs twice daily and feeling much better at time of visit. He was completing a 15 day prednisone taper. He was prescribed singulair at that visit.  He was seen by Dr. Delford FieldWright on 05/02/21. He never started singulair. His inhaler technique was appropriate and was given a spacer. He was treated with augmentin for 10 days for sinusitis and started on fluticasone nasal spray. He was started on pantoprazole for GERD.   He is a Production designer, theatre/television/filmmanager at [employer]. Vaping daily, he is vaping every 2 hours. He stopped smoking for vaping. Vaping for 2-4 years. Smoked cigarettes for 2 years before. He has cut back on the amount of vape he is using. He noticed an increase in his asthma symptoms over the past 2 years.  He reports increasing chest tightness, cough and intermittent wheezing since coming off the steroids. He has not picked up the montelukast yet.   He has history of childhood asthma.   Past Medical History:  Diagnosis Date   Asthma      Family  History  Family history unknown: Yes     Social History   Socioeconomic History   Marital status: Single    Spouse name: Not on file   Number of children: Not on file   Years of education: Not on file   Highest education level: Not on file  Occupational History   Not on file  Tobacco Use   Smoking status: Never    Passive exposure: Never   Smokeless tobacco: Never  Vaping Use   Vaping Use: Every day  Substance and Sexual Activity   Alcohol use: Not on file   Drug use: Not on file    Sexual activity: Not on file  Other Topics Concern   Not on file  Social History Narrative   Not on file   Social Determinants of Health   Financial Resource Strain: Not on file  Food Insecurity: Not on file  Transportation Needs: Not on file  Physical Activity: Not on file  Stress: Not on file  Social Connections: Not on file  Intimate Partner Violence: Not on file     Allergies  Allergen Reactions   Grass Pollen([id number] Vern) Cough    Rash and cough     Outpatient Medications Prior to Visit  Medication Sig Dispense Refill   albuterol (PROVENTIL) (2.5 MG/3ML) 0.083% nebulizer solution Take 3 mLs (2.5 mg total) by nebulization every 6 (six) hours as needed for wheezing or shortness of breath. 90 mL 3   albuterol (VENTOLIN HFA) 108 (90 Base) MCG/ACT inhaler Inhale 2 puffs into the lungs every 6 (six) hours as needed for wheezing or shortness of breath. 8.5 g 5   Albuterol Sulfate (PROAIR RESPICLICK) 108 (90 Base) MCG/ACT AEPB Inhale 1-2 puffs into the lungs every 6 (six) hours as needed. (Patient not taking: Reported on 10/11/2021) 1 each 5   buPROPion (WELLBUTRIN SR) 150 MG 12 hr tablet Take 1 tablet (150 mg total) by mouth 2 (two) times daily. Take 1 tablet daily for the first 7 days, then 1 tablet twice daily there after 60 tablet 4   fluticasone (FLONASE) 50 MCG/ACT nasal spray Place 1 spray into both nostrils daily. 16 g 2   fluticasone-salmeterol (ADVAIR DISKUS) 500-50 MCG/ACT AEPB Inhale 1 puff into the lungs in the morning and at bedtime. 60 each 5   magnesium 30 MG tablet Take 30 mg by mouth every evening.     montelukast (SINGULAIR) 10 MG tablet Take 1 tablet (10 mg total) by mouth at bedtime. 30 tablet 3   riboflavin (VITAMIN B-2) 100 MG TABS tablet Take 100 mg by mouth every evening. (Patient not taking: Reported on 10/11/2021)     No facility-administered medications prior to visit.   Review of Systems  Constitutional:  Positive for malaise/fatigue. Negative for  chills, fever and weight loss.  HENT:  Negative for congestion, sinus pain and sore throat.   Eyes: Negative.   Respiratory:  Negative for cough, hemoptysis, sputum production, shortness of breath and wheezing.   Cardiovascular:  Negative for chest pain, palpitations, orthopnea, claudication and leg swelling.  Gastrointestinal:  Negative for abdominal pain, heartburn, nausea and vomiting.  Genitourinary: Negative.   Musculoskeletal:  Negative for myalgias.  Skin:  Negative for rash.  Neurological:  Negative for weakness.  Psychiatric/Behavioral: Negative.     Objective:   There were no vitals filed for this visit.  Physical Exam Constitutional:      General: He is not in acute distress.    Appearance: He is obese.  HENT:     Head: Normocephalic and atraumatic.  Cardiovascular:     Rate and Rhythm: Normal rate and regular rhythm.     Pulses: Normal pulses.     Heart sounds: Normal heart sounds. No murmur heard. Pulmonary:     Effort: Pulmonary effort is normal.     Breath sounds: No wheezing, rhonchi or rales.  Musculoskeletal:     Right lower leg: No edema.     Left lower leg: No edema.  Skin:    General: Skin is warm and dry.  Neurological:     General: No focal deficit present.     Mental Status: He is alert.  Psychiatric:        Mood and Affect: Mood normal.        Behavior: Behavior normal.        Thought Content: Thought content normal.        Judgment: Judgment normal.    CBC    Component Value Date/Time   WBC 16.2 (H) 02/27/2021 0544   RBC 5.20 02/27/2021 0544   HGB 14.2 02/27/2021 0544   HCT 43.8 02/27/2021 0544   PLT 214 02/27/2021 0544   MCV 84.2 02/27/2021 0544   MCH 27.3 02/27/2021 0544   MCHC 32.4 02/27/2021 0544   RDW 13.7 02/27/2021 0544   LYMPHSABS 1.1 02/27/2021 0544   MONOABS 1.4 (H) 02/27/2021 0544   EOSABS 0.0 02/27/2021 0544   BASOSABS 0.0 02/27/2021 0544      Latest Ref Rng & Units 02/27/2021    5:44 AM 02/26/2021    4:57 AM  02/25/2021   11:18 AM  BMP  Glucose 70 - 99 mg/dL 892  119  417   BUN 6 - 20 mg/dL 17  12  14    Creatinine 0.61 - 1.24 mg/dL  4.08  1.44   Sodium 135 - 145 mmol/L 138  138  135   Potassium 3.5 - 5.1 mmol/L 4.0  4.3  4.0   Chloride 98 - 111 mmol/L 105  107  105   CO2 22 - 32 mmol/L 25  23  25    Calcium 8.9 - 10.3 mg/dL 9.0  9.7  9.1    Chest imaging: CT Chest 11/06/21 Cardiovascular: Heart size normal.  No pericardial effusion.   Mediastinum/Nodes: Triangular-shaped soft tissue in the prevascular space is indicative of thymus. No pathologically enlarged mediastinal, hilar or axillary lymph nodes. Esophagus is unremarkable.   Lungs/Pleura: Thin rind of pleural fluid in the lower right hemithorax. Probable associated pleural thickening. Minimal associated subpleural atelectasis in the lower right hemithorax. Smudgy 4 mm subpleural lymph node along the left major fissure. No follow-up necessary. Lungs are otherwise clear. Airway is unremarkable.  CXR 09/19/21 Decreased right pleural effusion and improved aeration within the right lung base. No pneumothorax.  There is interval appearance of moderate sized right pleural effusion. Possibility of underlying atelectasis/pneumonia in right lower lung field is not excluded.  CT Chest 02/26/21 1. Although slightly limited by patient respiratory motion, there is no evidence of clinically significant central, lobar or segmental sized pulmonary embolism. 2. Patchy areas of ground-glass attenuation are noted in the right middle lobe and left lower lobe, likely of infectious or inflammatory etiology.  PFT:    Latest Ref Rng & Units 08/02/2021   11:00 AM  PFT Results  FVC-Pre L 4.70   FVC-Predicted Pre % 86   FVC-Post L 5.04   FVC-Predicted Post % 92   Pre FEV1/FVC % % 65  Post FEV1/FCV % % 71   FEV1-Pre L 3.07   FEV1-Predicted Pre % 68   FEV1-Post L 3.57   DLCO uncorrected ml/min/mmHg 35.91   DLCO UNC% % 110   DLCO  corrected ml/min/mmHg 35.91   DLCO COR %Predicted % 110   DLVA Predicted % 108   TLC L 7.20   TLC % Predicted % 106   RV % Predicted % 134   PFTs 2023: Mild obstruction, significant bronchodilator response, air trapping  Labs:  Path:  Echo:  Heart Catheterization:  Assessment & Plan:   No diagnosis found.  Discussion: Dmario Russom is a 26 year old male, daily vaper who returns to pulmonary clinic for follow up of right pleural effusion.   He has exudative effusion based on pleural fluid studies with eosinophilic predominance. Cultures are negative and cytology is negative for malignancy.  His autoimmune workup is unrevealing at this time. He has been treated with augmentin and steroid taper.   The etiology of the exudative effusion includes infectious vs inflammatory.  Follow up x-ray shows persistent small right effusion. We will continue to monitor the effusion at this time.  Complete steroid taper as planned. Call if symptoms return.  Follow up in 3 months.   Melody Comas, MD [facility] Office: [phone]   Current Outpatient Medications:    albuterol (PROVENTIL) (2.5 MG/3ML) 0.083% nebulizer solution, Take 3 mLs (2.5 mg total) by nebulization every 6 (six) hours as needed for wheezing or shortness of breath., Disp: 90 mL, Rfl: 3   albuterol (VENTOLIN HFA) 108 (90 Base) MCG/ACT inhaler, Inhale 2 puffs into the lungs every 6 (six) hours as needed for wheezing or shortness of breath., Disp: 8.5 g, Rfl: 5   Albuterol Sulfate (PROAIR RESPICLICK) 108 (90 Base) MCG/ACT AEPB, Inhale 1-2 puffs into the lungs every 6 (six) hours as needed. (Patient not taking: Reported on 10/11/2021), Disp: 1 each, Rfl: 5   buPROPion (WELLBUTRIN SR) 150 MG 12 hr tablet, Take 1 tablet (150 mg total) by mouth 2 (two) times daily. Take 1 tablet daily for the first 7 days, then 1 tablet twice daily there after, Disp: 60 tablet, Rfl: 4   fluticasone (FLONASE) 50 MCG/ACT  nasal spray, Place 1 spray into both nostrils daily., Disp: 16 g, Rfl: 2   fluticasone-salmeterol (ADVAIR DISKUS) 500-50 MCG/ACT AEPB, Inhale 1 puff into the lungs in the morning and at bedtime., Disp: 60 each, Rfl: 5   magnesium 30 MG tablet, Take 30 mg by mouth every evening., Disp: , Rfl:    montelukast (SINGULAIR) 10 MG tablet, Take 1 tablet (10 mg total) by mouth at bedtime., Disp: 30 tablet, Rfl: 3   riboflavin (VITAMIN B-2) 100 MG TABS tablet, Take 100 mg by mouth every evening. (Patient not taking: Reported on 10/11/2021), Disp: , Rfl:

## 2022-07-31 ENCOUNTER — Other Ambulatory Visit: Payer: Self-pay | Admitting: Pulmonary Disease

## 2022-07-31 DIAGNOSIS — J4550 Severe persistent asthma, uncomplicated: Secondary | ICD-10-CM

## 2023-01-05 ENCOUNTER — Emergency Department (HOSPITAL_COMMUNITY): Payer: PRIVATE HEALTH INSURANCE

## 2023-01-05 ENCOUNTER — Emergency Department (HOSPITAL_COMMUNITY)
Admission: EM | Admit: 2023-01-05 | Discharge: 2023-01-05 | Disposition: A | Discharge status: Home / Self Care | Payer: PRIVATE HEALTH INSURANCE | Attending: Emergency Medicine | Admitting: Emergency Medicine

## 2023-01-05 ENCOUNTER — Emergency Department (HOSPITAL_BASED_OUTPATIENT_CLINIC_OR_DEPARTMENT_OTHER): Payer: PRIVATE HEALTH INSURANCE

## 2023-01-05 DIAGNOSIS — R030 Elevated blood-pressure reading, without diagnosis of hypertension: Secondary | ICD-10-CM

## 2023-01-05 DIAGNOSIS — J45909 Unspecified asthma, uncomplicated: Secondary | ICD-10-CM | POA: Insufficient documentation

## 2023-01-05 DIAGNOSIS — R600 Localized edema: Secondary | ICD-10-CM | POA: Insufficient documentation

## 2023-01-05 DIAGNOSIS — R739 Hyperglycemia, unspecified: Secondary | ICD-10-CM | POA: Insufficient documentation

## 2023-01-05 DIAGNOSIS — I10 Essential (primary) hypertension: Secondary | ICD-10-CM | POA: Insufficient documentation

## 2023-01-05 DIAGNOSIS — M7989 Other specified soft tissue disorders: Secondary | ICD-10-CM

## 2023-01-05 DIAGNOSIS — D72829 Elevated white blood cell count, unspecified: Secondary | ICD-10-CM | POA: Diagnosis not present

## 2023-01-05 LAB — COMPREHENSIVE METABOLIC PANEL
ALT: 33 U/L (ref 0–44)
AST: 26 U/L (ref 15–41)
Albumin: 4.2 g/dL (ref 3.5–5.0)
Alkaline Phosphatase: 63 U/L (ref 38–126)
Anion gap: 6 (ref 5–15)
BUN: 11 mg/dL (ref 6–20)
CO2: 27 mmol/L (ref 22–32)
Calcium: 9.3 mg/dL (ref 8.9–10.3)
Chloride: 104 mmol/L (ref 98–111)
Creatinine, Ser: 0.81 mg/dL (ref 0.61–1.24)
GFR, Estimated: >60 mL/min (ref >60)
Glucose, Bld: 109 mg/dL — ABNORMAL HIGH (ref 70–99)
Potassium: 4 mmol/L (ref 3.5–5.1)
Sodium: 137 mmol/L (ref 135–145)
Total Bilirubin: 0.4 mg/dL (ref <1.2)
Total Protein: 7.4 g/dL (ref 6.5–8.1)

## 2023-01-05 LAB — CBC WITH DIFFERENTIAL/PLATELET
Abs Immature Granulocytes: 0.04 10*3/uL (ref 0.00–0.07)
Basophils Absolute: 0.1 10*3/uL (ref 0.0–0.1)
Basophils Relative: 0 %
Eosinophils Absolute: 0.7 10*3/uL — ABNORMAL HIGH (ref 0.0–0.5)
Eosinophils Relative: 6 %
HCT: 44 % (ref 39.0–52.0)
Hemoglobin: 13.9 g/dL (ref 13.0–17.0)
Immature Granulocytes: 0 %
Lymphocytes Relative: 12 %
Lymphs Abs: 1.3 10*3/uL (ref 0.7–4.0)
MCH: 27.8 pg (ref 26.0–34.0)
MCHC: 31.6 g/dL (ref 30.0–36.0)
MCV: 88 fL (ref 80.0–100.0)
Monocytes Absolute: 1 10*3/uL (ref 0.1–1.0)
Monocytes Relative: 9 %
Neutro Abs: 8.2 10*3/uL — ABNORMAL HIGH (ref 1.7–7.7)
Neutrophils Relative %: 73 %
Platelets: 256 10*3/uL (ref 150–400)
RBC: 5 MIL/uL (ref 4.22–5.81)
RDW: 13 % (ref 11.5–15.5)
WBC: 11.3 10*3/uL — ABNORMAL HIGH (ref 4.0–10.5)
nRBC: 0 % (ref 0.0–0.2)

## 2023-01-05 LAB — URINALYSIS, ROUTINE W REFLEX MICROSCOPIC
Bacteria, UA: NONE SEEN
Bilirubin Urine: NEGATIVE
Glucose, UA: NEGATIVE mg/dL
Ketones, ur: NEGATIVE mg/dL
Leukocytes,Ua: NEGATIVE
Nitrite: NEGATIVE
Protein, ur: NEGATIVE mg/dL
Specific Gravity, Urine: 1.019 (ref 1.005–1.030)
pH: 6 (ref 5.0–8.0)

## 2023-01-05 LAB — BRAIN NATRIURETIC PEPTIDE: B Natriuretic Peptide: 32.6 pg/mL (ref 0.0–100.0)

## 2023-01-05 NOTE — ED Triage Notes (Addendum)
Patient reports last few months has had swelling in BLE. Says he is bruising easily Says on right leg behind knee he feels a knot and tightness x 2 days Says if he sits down too long or sleeps in weird position legs swell up very bad  Upon assessment, patient says back of right leg is tender to touch, patient says he feels heat coming from legs Patient also reports large bruise on left flank area that has been there for a week, he is concerned because the impact should not have caused such a big bruise Patient says legs today do look better than yesterday , patient concerned for DVT Patient went to urgent care today, was told to come to ED Denies any increase in shobr from baseline Pain denied in triage  BLE appear swollen and red  Patient says he has also gained 100 pounds in the past year without any diet changes /concerned about this  Patient also says the swelling/issues are when he is sitting down and after he gets up and walks around he is fine  Patient has PCP but does not visit. Says he only went there once to get referral to asthma doctor.

## 2023-01-05 NOTE — ED Provider Notes (Signed)
[place] EMERGENCY DEPARTMENT AT [facility] Provider Note   CSN: [id number] Arrival date & time: 01/05/23  1442     History Chief Complaint  Patient presents with   Leg Swelling    Cody Ward is a 27 y.o. male.  Patient with past history significant for hypertensive disorder, allergic rhinitis, asthma presents to the emergency department concerns of leg swelling.  He does remark to me that he previously had some sort of renal abnormality as a kid and had several episodes of renal failure.  Remarks that over the last year or so he has had about 100 pounds of weight gain but denies any shortness of breath or exertional dyspnea.  States that he has intermittent swelling of bilateral lower extremities typically improves with use of compression stockings and ambulation but at times worsens.  Also currently reporting some pain in the posterior right calf as well as right knee.  No prior history of DVT and not currently on blood thinners.  Took a dose of aspirin yesterday out of concern.  HPI     Home Medications Prior to Admission medications   Medication Sig Start Date End Date Taking? Authorizing Provider  albuterol (PROVENTIL) (2.5 MG/3ML) 0.083% nebulizer solution Take 3 mLs (2.5 mg total) by nebulization every 6 (six) hours as needed for wheezing or shortness of breath. 09/19/21   Martina Sinner, MD  albuterol (VENTOLIN HFA) 108 (90 Base) MCG/ACT inhaler Inhale 2 puffs into the lungs every 6 (six) hours as needed for wheezing or shortness of breath. 06/15/21   Martina Sinner, MD  Albuterol Sulfate (PROAIR RESPICLICK) 108 (90 Base) MCG/ACT AEPB Inhale 1-2 puffs into the lungs every 6 (six) hours as needed. Patient not taking: Reported on 10/11/2021 06/14/21 11/11/21  Martina Sinner, MD  buPROPion [facility] SR) 150 MG 12 hr tablet Take 1 tablet (150 mg total) by mouth 2 (two) times daily. Take 1 tablet daily for the first 7 days, then 1 tablet twice daily there after  06/01/21   Martina Sinner, MD  fluticasone [facility]) 50 MCG/ACT nasal spray Place 1 spray into both nostrils daily. 08/02/21   Martina Sinner, MD  fluticasone-salmeterol [facility] INHUB) 500-50 MCG/ACT AEPB INHALE 1 PUFF INTO THE LUNGS IN THE MORNING AND AT BEDTIME. 07/31/22   Martina Sinner, MD  magnesium 30 MG tablet Take 30 mg by mouth every evening.    [provider]  montelukast (SINGULAIR) 10 MG tablet Take 1 tablet (10 mg total) by mouth at bedtime. 08/02/21   Martina Sinner, MD  riboflavin (VITAMIN B-2) 100 MG TABS tablet Take 100 mg by mouth every evening. Patient not taking: Reported on 10/11/2021    [provider]      Allergies    Grass pollen(k-o-r-t-swt vern)    Review of Systems   Review of Systems  Cardiovascular:  Positive for leg swelling.  All other systems reviewed and are negative.   Physical Exam Updated Vital Signs BP (!) 162/66   Pulse 71   Temp 97.7 F (36.5 C) (Oral)   Resp 20   Wt (!) 160.6 kg   SpO2 96%   BMI 50.79 kg/m  Physical Exam Vitals and nursing note reviewed.  Constitutional:      General: He is not in acute distress.    Appearance: He is well-developed.  HENT:     Head: Normocephalic and atraumatic.  Eyes:     Conjunctiva/sclera: Conjunctivae normal.  Cardiovascular:  Rate and Rhythm: Normal rate and regular rhythm.     Heart sounds: No murmur heard. Pulmonary:     Effort: Pulmonary effort is normal. No respiratory distress.     Breath sounds: Normal breath sounds.  Abdominal:     Palpations: Abdomen is soft.     Tenderness: There is no abdominal tenderness.  Musculoskeletal:        General: No swelling.     Cervical back: Neck supple.     Right lower leg: 2+ Edema present.     Left lower leg: 2+ Edema present.  Skin:    General: Skin is warm and dry.     Capillary Refill: Capillary refill takes less than 2 seconds.  Neurological:     Mental Status: He is alert.  Psychiatric:        Mood  and Affect: Mood normal.     ED Results / Procedures / Treatments   Labs (all labs ordered are listed, but only abnormal results are displayed) Labs Reviewed  CBC WITH DIFFERENTIAL/PLATELET - Abnormal; Notable for the following components:      Result Value   WBC 11.3 (*)    Neutro Abs 8.2 (*)    Eosinophils Absolute 0.7 (*)    All other components within normal limits  COMPREHENSIVE METABOLIC PANEL - Abnormal; Notable for the following components:   Glucose, Bld 109 (*)    All other components within normal limits  URINALYSIS, ROUTINE W REFLEX MICROSCOPIC - Abnormal; Notable for the following components:   Hgb urine dipstick SMALL (*)    All other components within normal limits  BRAIN NATRIURETIC PEPTIDE    EKG None  Radiology VAS [place] LOWER EXTREMITY VENOUS (DVT) (7a-7p)  Result Date: 01/05/2023  Lower Venous DVT Study Patient Name:  Cody Ward  Date of Exam:   01/05/2023 Medical Rec #: [id number]     Accession #:    [id number] Date of Birth: 02/23/1995     Patient Gender: M Patient Age:   89 years Exam Location:  [facility] Procedure:      VAS [place] LOWER EXTREMITY VENOUS (DVT) Referring Phys: Maryanna Shape --------------------------------------------------------------------------------  Indications: Pain, Swelling, and Edema.  Comparison Study: No prior exam. Performing Technologist: Fernande Bras  Examination Guidelines: A complete evaluation includes B-mode imaging, spectral Doppler, color Doppler, and power Doppler as needed of all accessible portions of each vessel. Bilateral testing is considered an integral part of a complete examination. Limited examinations for reoccurring indications may be performed as noted. The reflux portion of the exam is performed with the patient in reverse Trendelenburg.  +---------+---------------+---------+-----------+----------+--------------+ RIGHT    CompressibilityPhasicitySpontaneityPropertiesThrombus Aging  +---------+---------------+---------+-----------+----------+--------------+ CFV      Full           Yes      Yes                                 +---------+---------------+---------+-----------+----------+--------------+ SFJ      Full                                                        +---------+---------------+---------+-----------+----------+--------------+ FV Prox  Full                                                        +---------+---------------+---------+-----------+----------+--------------+  FV Mid   Full                                                        +---------+---------------+---------+-----------+----------+--------------+ FV DistalFull                                                        +---------+---------------+---------+-----------+----------+--------------+ PFV      Full                                                        +---------+---------------+---------+-----------+----------+--------------+ POP      Full           Yes      Yes                                 +---------+---------------+---------+-----------+----------+--------------+ PTV      Full                                                        +---------+---------------+---------+-----------+----------+--------------+ PERO     Full                                                        +---------+---------------+---------+-----------+----------+--------------+   +---------+---------------+---------+-----------+----------+--------------+ LEFT     CompressibilityPhasicitySpontaneityPropertiesThrombus Aging +---------+---------------+---------+-----------+----------+--------------+ CFV      Full           Yes      Yes                                 +---------+---------------+---------+-----------+----------+--------------+ SFJ      Full                                                         +---------+---------------+---------+-----------+----------+--------------+ FV Prox  Full                                                        +---------+---------------+---------+-----------+----------+--------------+ FV Mid   Full                                                        +---------+---------------+---------+-----------+----------+--------------+  FV DistalFull                                                        +---------+---------------+---------+-----------+----------+--------------+ PFV      Full                                                        +---------+---------------+---------+-----------+----------+--------------+ POP      Full           Yes      Yes                                 +---------+---------------+---------+-----------+----------+--------------+ PTV      Full                                                        +---------+---------------+---------+-----------+----------+--------------+ PERO     Full                                                        +---------+---------------+---------+-----------+----------+--------------+    Summary: BILATERAL: - No evidence of deep vein thrombosis seen in the lower extremities, bilaterally. -No evidence of popliteal cyst, bilaterally.   *See table(s) above for measurements and observations.    Preliminary    DG Chest 2 View  Result Date: 01/05/2023 CLINICAL DATA:  Leg swelling.  Congestion. EXAM: CHEST - 2 VIEW COMPARISON:  10/11/2021. FINDINGS: Bilateral lung fields are clear. Bilateral costophrenic angles are clear. Normal cardio-mediastinal silhouette. No acute osseous abnormalities. The soft tissues are within normal limits. IMPRESSION: *No active cardiopulmonary disease. Electronically Signed   By: Jules Schick M.D.   On: 01/05/2023 16:39    Procedures Procedures   Medications Ordered in ED Medications - No data to display  ED Course/ Medical Decision Making/  A&P                               Medical Decision Making Amount and/or Complexity of Data Reviewed Labs: ordered. Radiology: ordered.   This patient presents to the ED for concern of leg swelling.  Differential diagnosis includes new onset CHF, DVT, lymphedema, cellulitis   Lab Tests:  I Ordered, and personally interpreted labs.  The pertinent results include: CBC with mild leukocytosis at 11.3, CMP with mild hyperglycemia 109, urinalysis with some small hemoglobin seen, BMP unremarkable at 32.6   Imaging Studies ordered:  I ordered imaging studies including chest x-ray, bilateral lower extremity ultrasound I independently visualized and interpreted imaging which showed no evidence of any acute cardiopulmonary process, no evidence of DVT or cyst I agree with the radiologist interpretation   Problem List / ED Course:  Patient with past history significant for hypertensive disorder, allergic rhinitis,  asthma presents to the emergency department concerns of leg swelling.  Reports that he also had some history of renal abnormality with several episodes of renal failure and CKD.  Unsure exactly what this was all from.  Here today with concerns of intermittent episodes of leg swelling for the last several weeks acutely worsening.  Also endorses some redness and warmth to primarily the right lower leg but this improves intermittently.  Denies any recent fever or other systemic illness.  No prior history of DVT but concern for possible DVT at this time.  Not on any blood thinners.  Workup initiated with CBC, CMP, BMP, urinalysis as well as chest x-ray and ultrasound of bilateral lower extremities.  Doubtful of DVT given involvement of bilateral legs although there is some noticeable swelling slightly worse in the right leg.  Not acutely concern for cellulitis at this time as there is minimal erythema and no palpable warmth. Patient's labs are largely reassuring with only mild leukocytosis at  11.3.  Doubt cellulitis however given that there is minimal erythema and warmth at this time.  Appears more likely the patient symptoms are due to lymphadenopathy or other fluid overload status.  Pending other labs at this time. BNP is negative so doubt CHF.  CMP shows no evidence of renal impairment and urinalysis shows no protein so doubt acute kidney dysfunction.  There is some small hemoglobin present although patient currently denies any back pain or any prior history of blood in his urine. Chest x-ray is unremarkable and vascular ultrasound of bilateral lower extremities does not show signs of a DVT.  Unsure at this time exact mechanism of patient's swelling although appears that he has had some success with dietary modifications by reducing sodium as well as compression stockings.  Encourage patient to continue on with these modifications but advised strict follow-up with primary care provider for further assessment to ensure resolution of symptoms.  Also discussed return precautions.  Patient agreement with current plan verbalized understanding return precautions.  No other acute concerns at this time patient was discharged home in stable condition.  Final Clinical Impression(s) / ED Diagnoses Final diagnoses:  Leg edema  Elevated blood pressure reading    Rx / DC Orders ED Discharge Orders     None         Smitty Knudsen, PA-C 01/05/23 2359    Pricilla Loveless, MD 01/06/23 1526

## 2023-01-05 NOTE — Discharge Instructions (Addendum)
You were seen in the ER today for swelling in your legs. Your labs and imaging were reassuring today with no signs of any clots, infection, or other abnormality. I would try to wear compression stockings and elevate your legs while resting to try to help some of this pain improve. If symptoms worsen, return to the ER. Otherwise follow up with your primary care provider for further evaluation.

## 2023-03-05 ENCOUNTER — Ambulatory Visit: Payer: PRIVATE HEALTH INSURANCE | Admitting: Pulmonary Disease

## 2023-10-18 IMAGING — CR DG CHEST 2V
2 series · 2 of 2 positions shown · non-contrast
Comparison: Chest x-ray 02/07/2021.

CLINICAL DATA: 25-year-old male with history of dyspnea.

EXAM:
CHEST - 2 VIEW

[w chest pa]
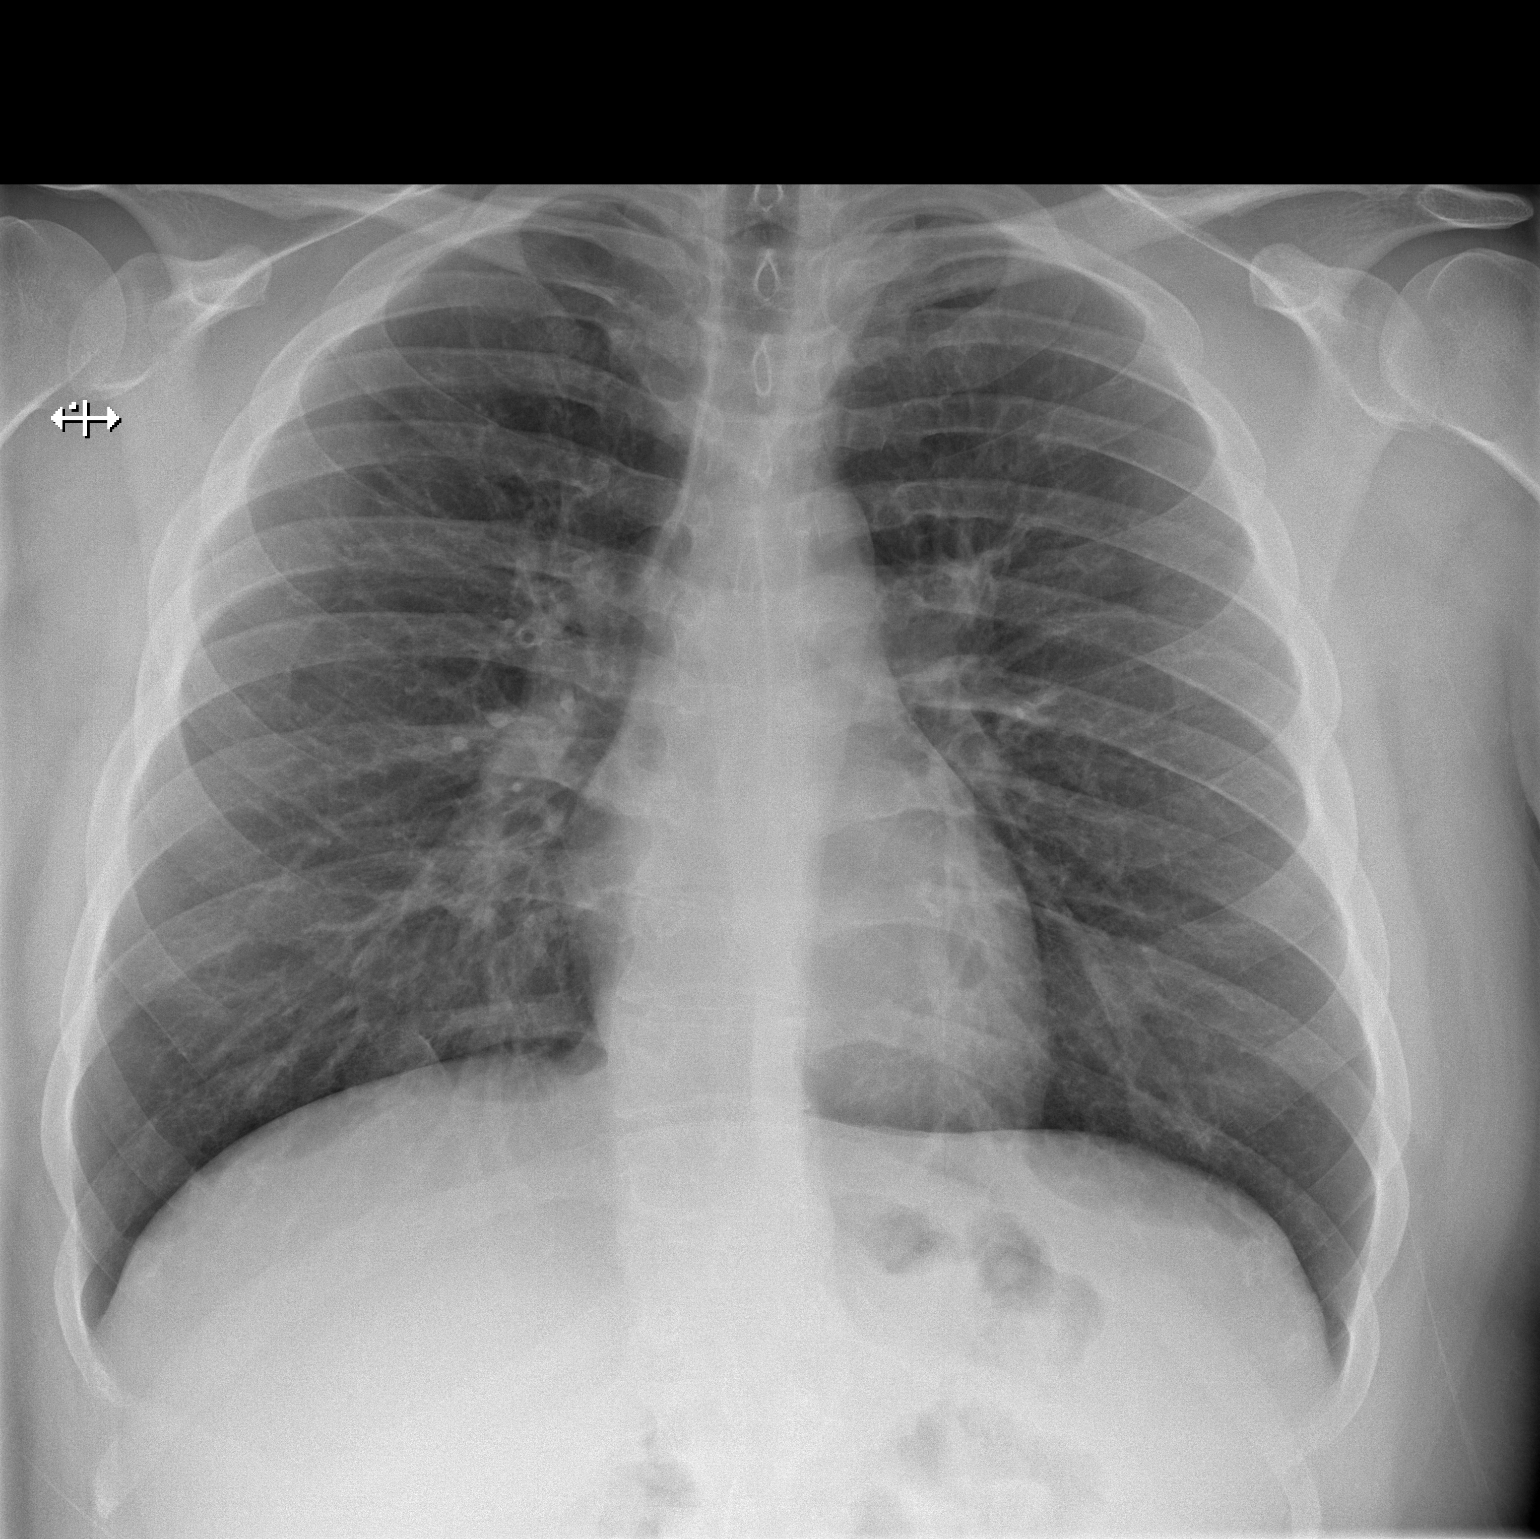

[w chest lat]
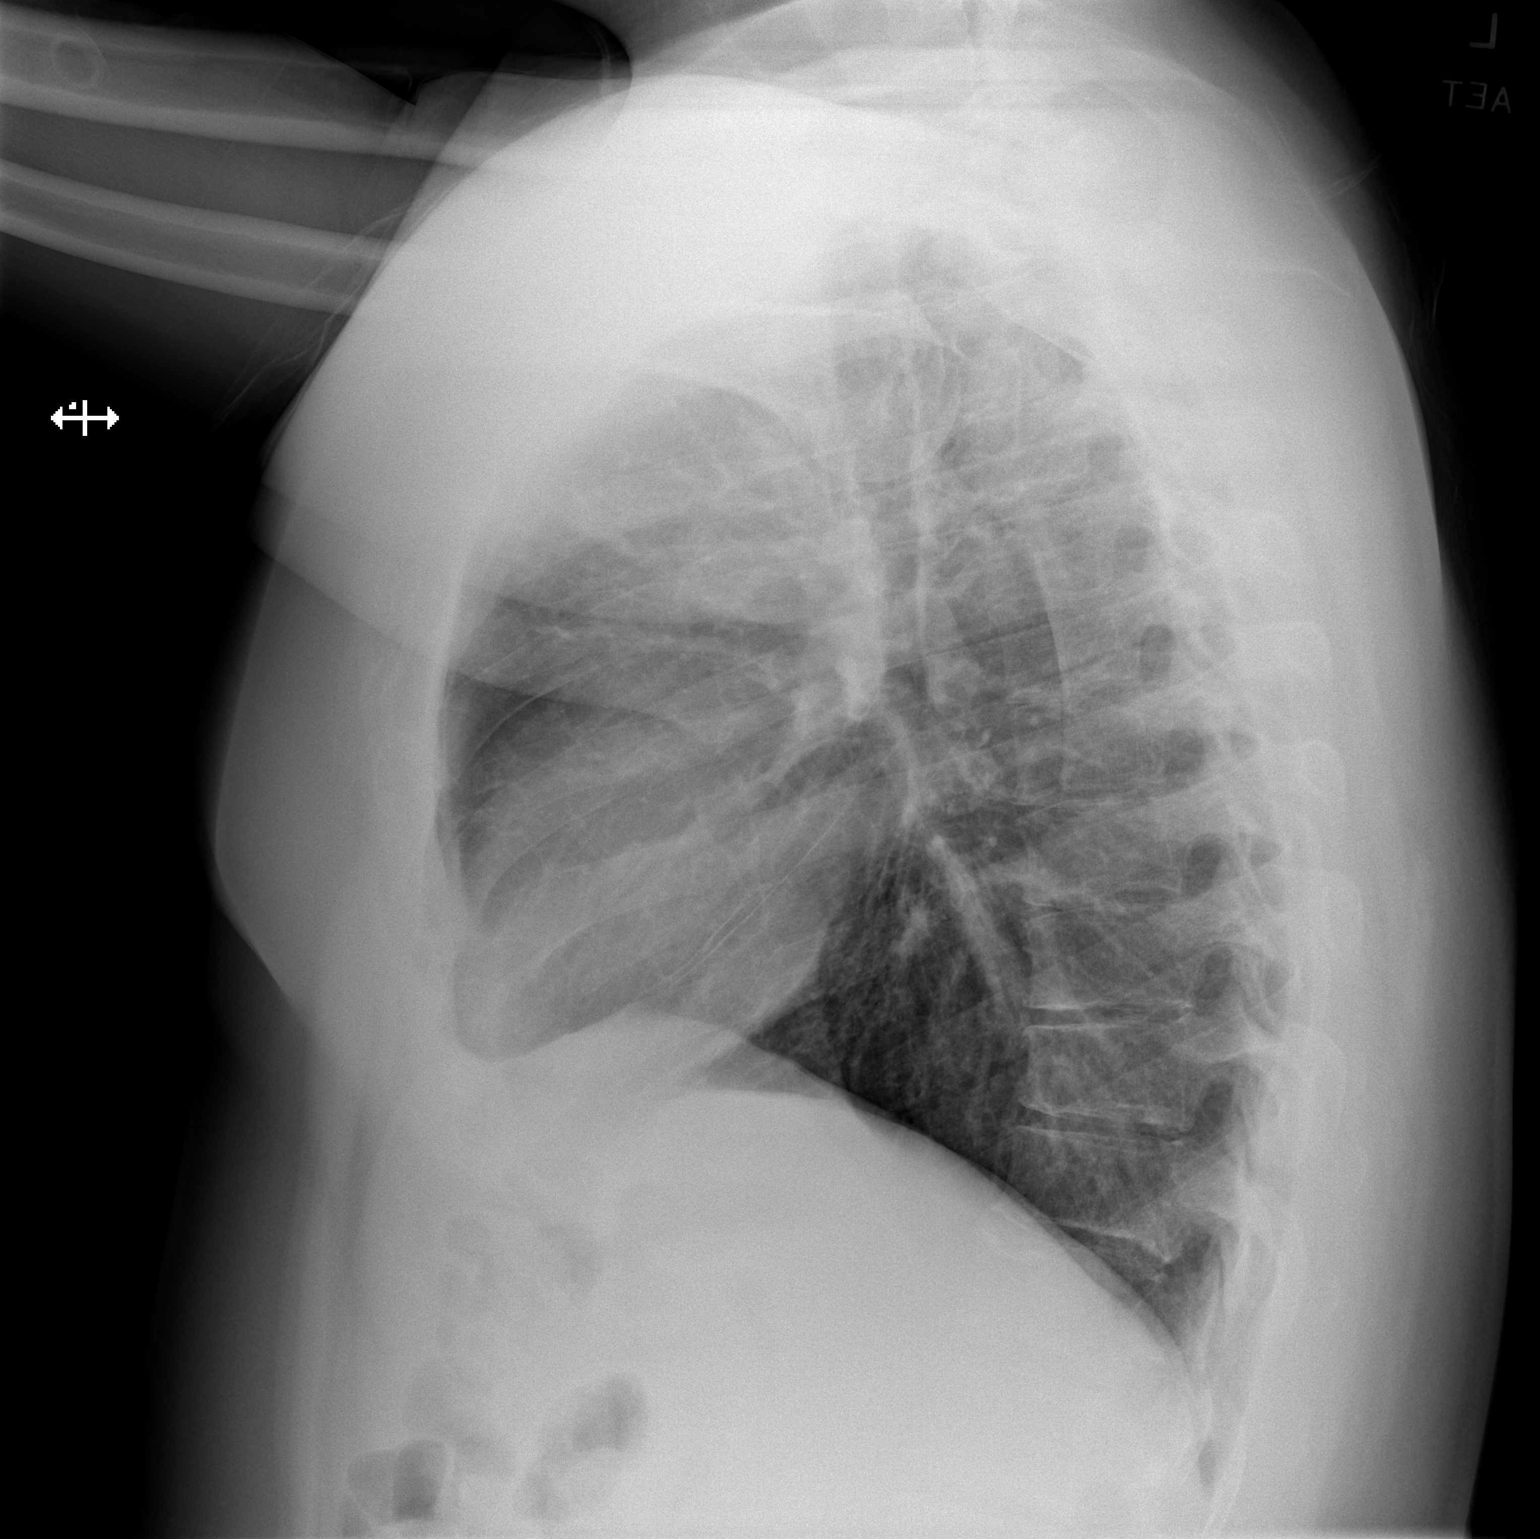

[2 of 2 positions shown; findings below may reference images not displayed]

FINDINGS: Lung volumes are normal. No consolidative airspace disease. No
pleural effusions. No pneumothorax. No pulmonary nodule or mass
noted. Pulmonary vasculature and the cardiomediastinal silhouette
are within normal limits.
IMPRESSION: No radiographic evidence of acute cardiopulmonary disease.

## 2023-10-19 ENCOUNTER — Encounter (HOSPITAL_COMMUNITY): Payer: Self-pay

## 2023-10-19 ENCOUNTER — Observation Stay (HOSPITAL_COMMUNITY)
Admission: EM | Admit: 2023-10-19 | Discharge: 2023-10-21 | Disposition: A | Discharge status: Home / Self Care | Payer: PRIVATE HEALTH INSURANCE | Attending: Emergency Medicine | Admitting: Emergency Medicine

## 2023-10-19 ENCOUNTER — Emergency Department (HOSPITAL_COMMUNITY): Payer: PRIVATE HEALTH INSURANCE

## 2023-10-19 DIAGNOSIS — F419 Anxiety disorder, unspecified: Secondary | ICD-10-CM | POA: Insufficient documentation

## 2023-10-19 DIAGNOSIS — F603 Borderline personality disorder: Secondary | ICD-10-CM | POA: Diagnosis not present

## 2023-10-19 DIAGNOSIS — R079 Chest pain, unspecified: Secondary | ICD-10-CM | POA: Diagnosis present

## 2023-10-19 DIAGNOSIS — J4541 Moderate persistent asthma with (acute) exacerbation: Principal | ICD-10-CM | POA: Insufficient documentation

## 2023-10-19 DIAGNOSIS — J45901 Unspecified asthma with (acute) exacerbation: Secondary | ICD-10-CM | POA: Diagnosis present

## 2023-10-19 DIAGNOSIS — R030 Elevated blood-pressure reading, without diagnosis of hypertension: Secondary | ICD-10-CM | POA: Insufficient documentation

## 2023-10-19 DIAGNOSIS — I10 Essential (primary) hypertension: Secondary | ICD-10-CM | POA: Insufficient documentation

## 2023-10-19 DIAGNOSIS — R7303 Prediabetes: Secondary | ICD-10-CM | POA: Diagnosis not present

## 2023-10-19 DIAGNOSIS — Z6841 Body Mass Index (BMI) 40.0 and over, adult: Secondary | ICD-10-CM | POA: Diagnosis not present

## 2023-10-19 IMAGING — CT CT ANGIO CHEST
2 of 7 series · 18 of 46 positions shown · IV contrast (agent unspecified)
Comparison: No priors.

CLINICAL DATA: 25-year-old male with history of shortness of breath
and cough for the past 2 weeks.

EXAM:
CT ANGIOGRAPHY CHEST WITH CONTRAST
TECHNIQUE: Multidetector CT imaging of the chest was performed using the
standard protocol during bolus administration of intravenous
contrast. Multiplanar CT image reconstructions and MIPs were
obtained to evaluate the vascular anatomy.

[Series 6: thins · axial · 0.88mm/px · z∈[+1439,+1745]mm · 16 of 346 slices shown]
[im 20/346  lung]
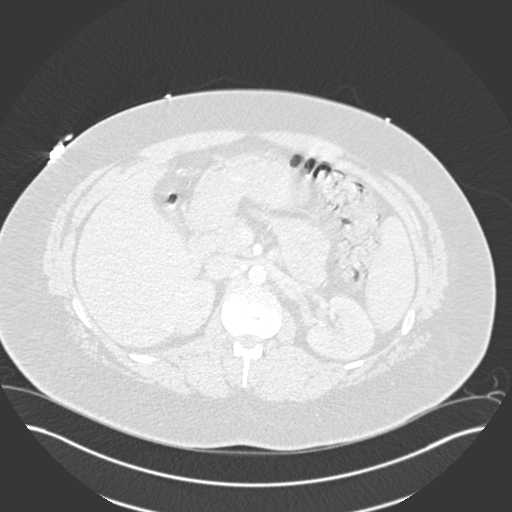
[im 39/346  soft-tissue]
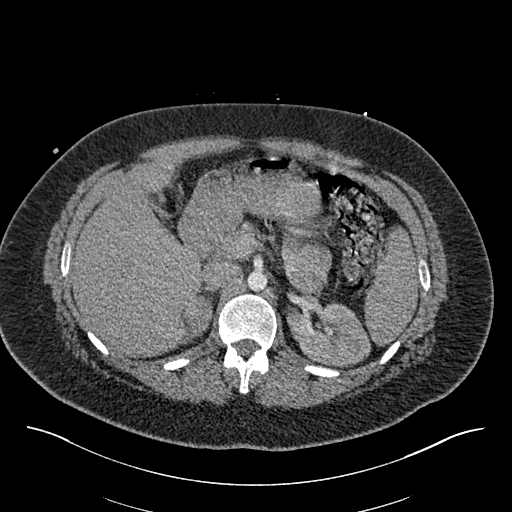
[im 58/346  lung]
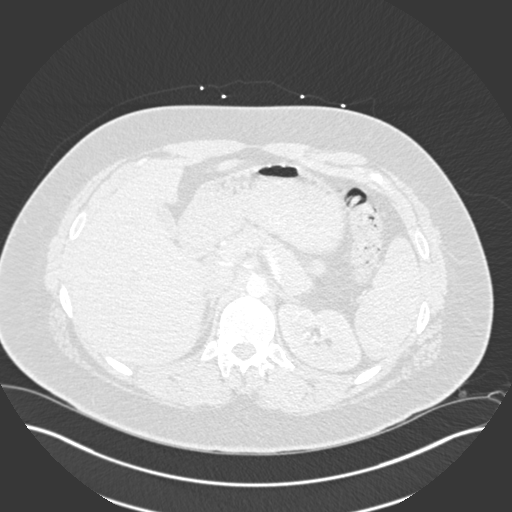
[im 77/346  soft-tissue]
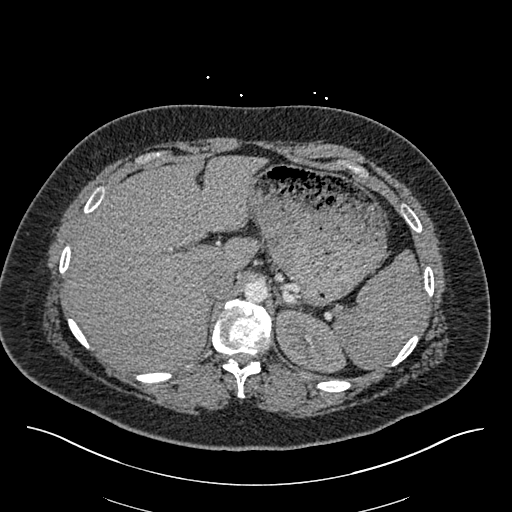
[im 96/346  lung]
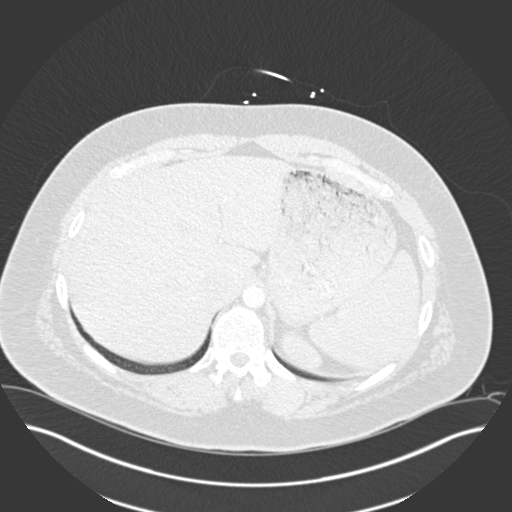
[im 116/346  soft-tissue]
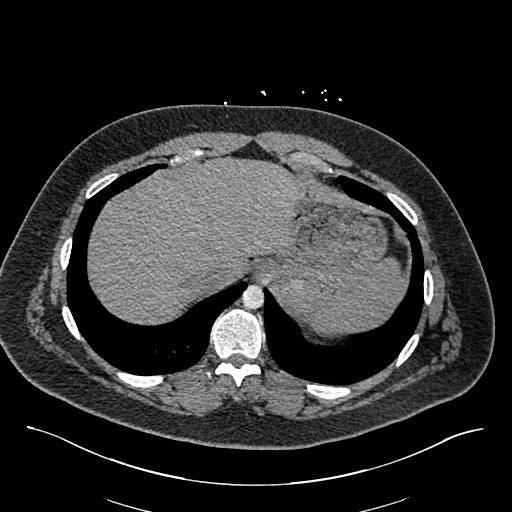
[im 135/346  lung]
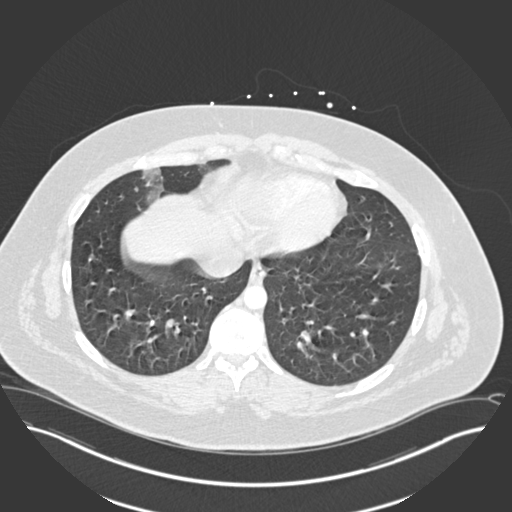
[im 154/346  soft-tissue]
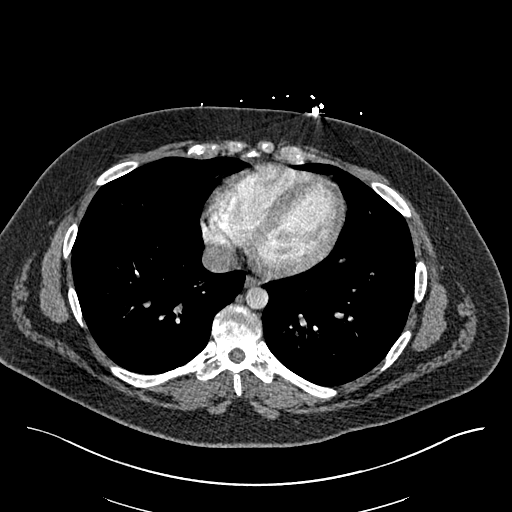
[im 192/346  lung]
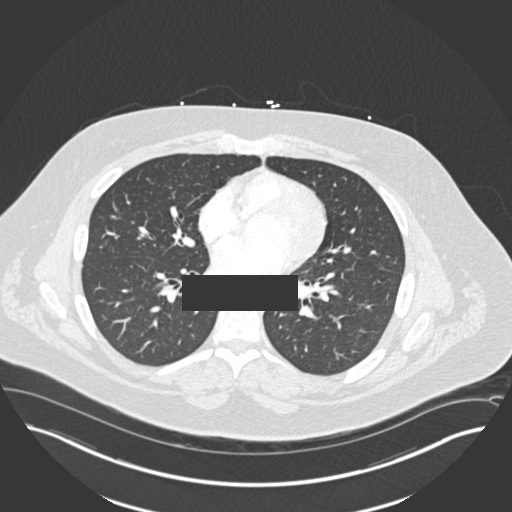
[im 211/346  soft-tissue]
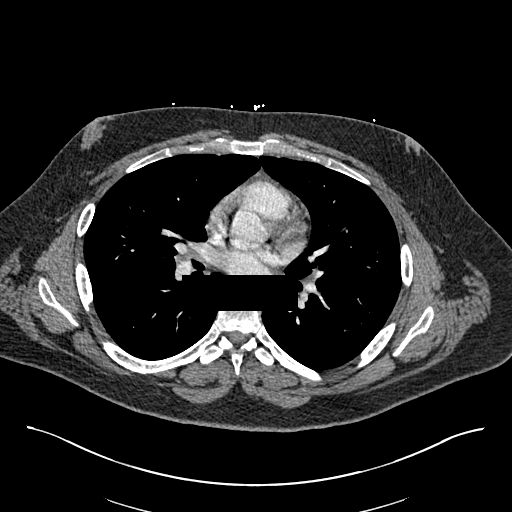
[im 231/346  lung]
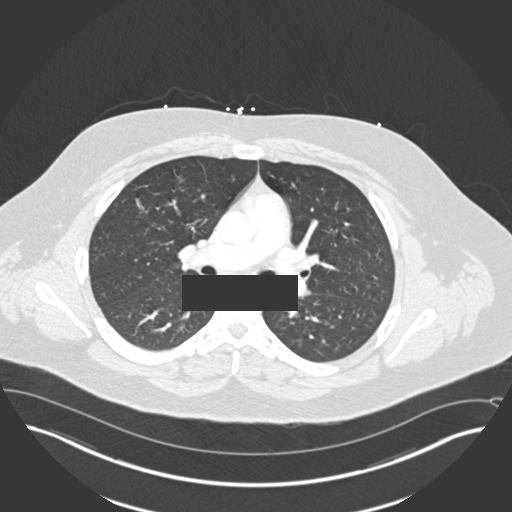
[im 250/346  soft-tissue]
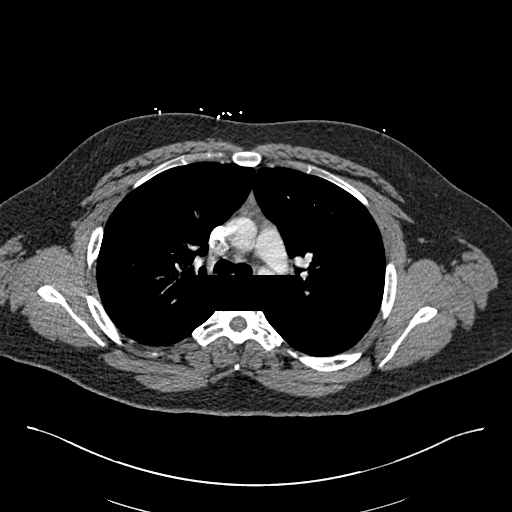
[im 269/346  lung]
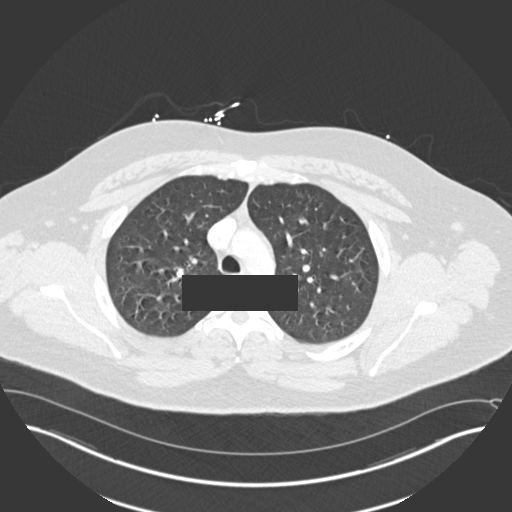
[im 288/346  soft-tissue]
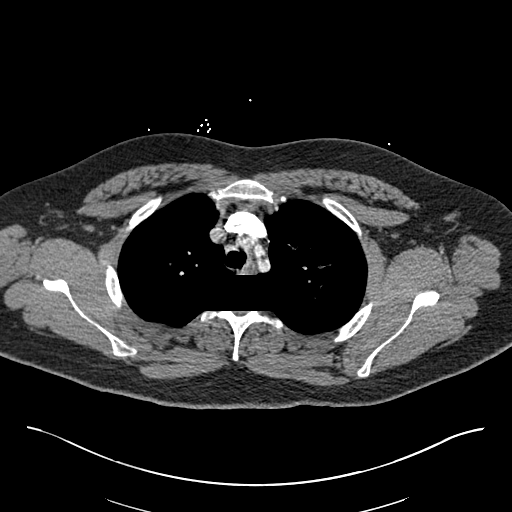
[im 307/346  lung]
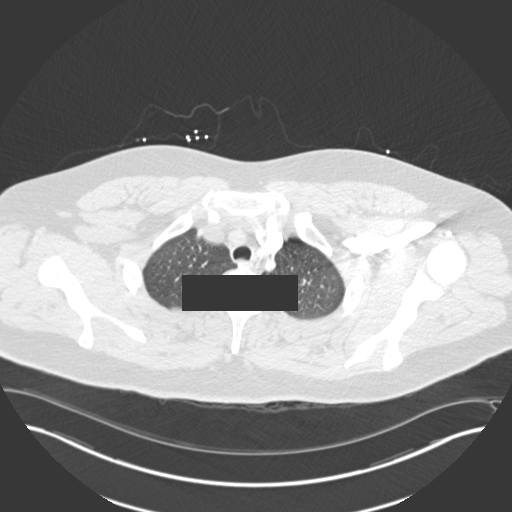
[im 326/346  soft-tissue]
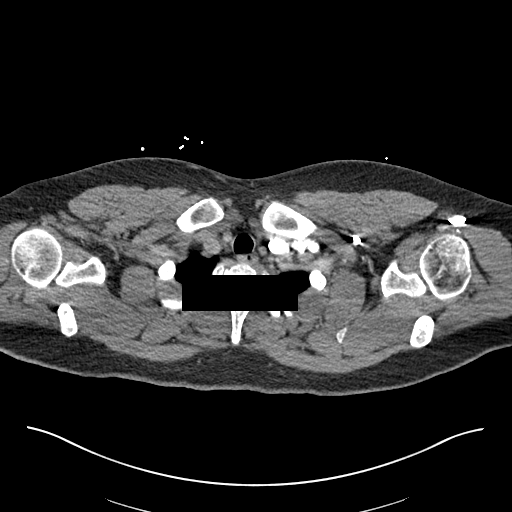

[Series 7: coronal mpr · coronal · 0.67mm/px · 2 of 98 slices shown]
[im 33/98  soft-tissue]
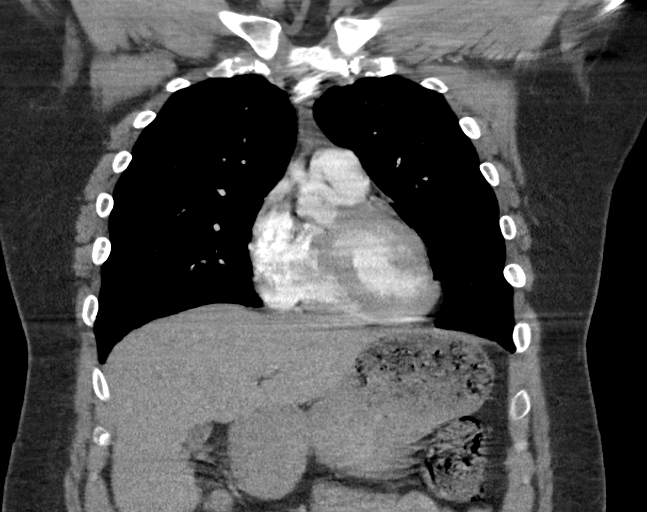
[im 65/98  soft-tissue]
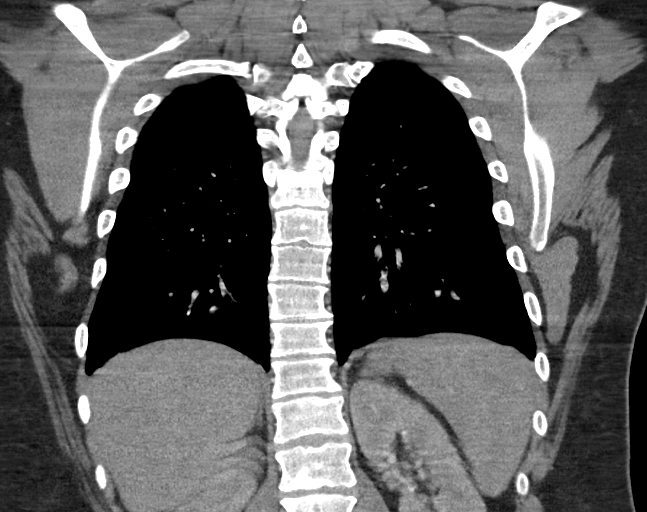

[18 of 46 positions shown; findings below may reference images not displayed]

RADIATION DOSE REDUCTION: This exam was performed according to the
departmental dose-optimization program which includes automated
exposure control, adjustment of the mA and/or kV according to
patient size and/or use of iterative reconstruction technique.

CONTRAST:  75mL OMNIPAQUE IOHEXOL 350 MG/ML SOLN
FINDINGS: Cardiovascular: No filling defect in the central, lobar or segmental
sized pulmonary arteries to suggest pulmonary embolism. Smaller
subsegmental sized emboli can not be completely excluded secondary
to extensive patient respiratory motion. Heart size is normal. There
is no significant pericardial fluid, thickening or pericardial
calcification. No atherosclerotic calcifications are noted in the
thoracic aorta or the coronary arteries.

Mediastinum/Nodes: No pathologically enlarged mediastinal or hilar
lymph nodes. Esophagus is unremarkable in appearance. No axillary
lymphadenopathy.

Lungs/Pleura: Ground-glass attenuation in the right middle lobe and
left lower lobe is nonspecific, but likely of infectious or
inflammatory etiology. No acute consolidative airspace disease. No
pleural effusions. No suspicious appearing pulmonary nodules or
masses are noted.

Upper Abdomen: Unremarkable.

Musculoskeletal: There are no aggressive appearing lytic or blastic
lesions noted in the visualized portions of the skeleton.

Review of the MIP images confirms the above findings.
IMPRESSION: 1. Although slightly limited by patient respiratory motion, there is
no evidence of clinically significant central, lobar or segmental
sized pulmonary embolism.
2. Patchy areas of ground-glass attenuation are noted in the right
middle lobe and left lower lobe, likely of infectious or
inflammatory etiology.

## 2023-10-19 MED ORDER — MAGNESIUM SULFATE 2 GM/50ML IV SOLN
2.0000 g | INTRAVENOUS | Status: AC
  Administered 2023-10-20: 2 g via INTRAVENOUS
  Filled 2023-10-19: qty 50

## 2023-10-19 MED ORDER — ALBUTEROL SULFATE (2.5 MG/3ML) 0.083% IN NEBU
10.0000 mg/h | INHALATION_SOLUTION | RESPIRATORY_TRACT | Status: AC
  Administered 2023-10-19: 10 mg/h via RESPIRATORY_TRACT
  Filled 2023-10-19: qty 12
  Filled 2023-10-19: qty 3
  Filled 2023-10-19: qty 15

## 2023-10-19 MED ORDER — METHYLPREDNISOLONE SODIUM SUCC 125 MG IJ SOLR
125.0000 mg | Freq: Once | INTRAMUSCULAR | Status: AC
  Administered 2023-10-20: 125 mg via INTRAVENOUS
  Filled 2023-10-19: qty 2

## 2023-10-19 NOTE — ED Triage Notes (Signed)
 Pt states that he has been having central CP and SOB all day, hx of asthma unrelieved by home relief meds. Pt wheezing, speaking in short sentences

## 2023-10-19 NOTE — ED Provider Notes (Incomplete)
 [place] EMERGENCY DEPARTMENT AT [facility] Provider Note   CSN: [id number] Arrival date & time: 10/19/23  2325     Patient presents with: Chest Pain   Cody Ward is a 28 y.o. male.  {Add pertinent medical, surgical, social history, OB history to YEP:67052} The history is provided by the patient and medical records.  Chest Pain  28 year old male with history of asthma, obesity, hypertension, presenting to the ED with chest pain and shortness of breath.  States asthma during childhood, flared up again a few years ago.  States throughout the summer he has been having a lot of issues, almost to the point of doing breathing treatments on a daily basis.  Symptoms worse today than prior.  He denies any fever.  No sick contacts.  No nausea/vomiting.  States he has done several nebs today without relief.  Chest feels very tight like it cannot expand all the way.  No prior cardiac hx.    Prior to Admission medications   Medication Sig Start Date End Date Taking? Authorizing Provider  albuterol  (PROVENTIL ) (2.5 MG/3ML) 0.083% nebulizer solution Take 3 mLs (2.5 mg total) by nebulization every 6 (six) hours as needed for wheezing or shortness of breath. 09/19/21   Kara Dorn NOVAK, MD  albuterol  (VENTOLIN  HFA) 108 (90 Base) MCG/ACT inhaler Inhale 2 puffs into the lungs every 6 (six) hours as needed for wheezing or shortness of breath. 06/15/21   Kara Dorn NOVAK, MD  Albuterol  Sulfate (PROAIR  RESPICLICK) 108 (90 Base) MCG/ACT AEPB Inhale 1-2 puffs into the lungs every 6 (six) hours as needed. Patient not taking: Reported on 10/11/2021 06/14/21 11/11/21  Kara Dorn NOVAK, MD  buPROPion  (WELLBUTRIN  SR) 150 MG 12 hr tablet Take 1 tablet (150 mg total) by mouth 2 (two) times daily. Take 1 tablet daily for the first 7 days, then 1 tablet twice daily there after 06/01/21   Kara Dorn NOVAK, MD  fluticasone  (FLONASE ) 50 MCG/ACT nasal spray Place 1 spray into both nostrils daily. 08/02/21    Kara Dorn NOVAK, MD  fluticasone -salmeterol ([facility] INHUB ) 500-50 MCG/ACT AEPB INHALE 1 PUFF INTO THE LUNGS IN THE MORNING AND AT BEDTIME. 07/31/22   Kara Dorn NOVAK, MD  magnesium  30 MG tablet Take 30 mg by mouth every evening.    [provider]  montelukast  (SINGULAIR ) 10 MG tablet Take 1 tablet (10 mg total) by mouth at bedtime. 08/02/21   Kara Dorn NOVAK, MD  riboflavin (VITAMIN B-2) 100 MG TABS tablet Take 100 mg by mouth every evening. Patient not taking: Reported on 10/11/2021    [provider]    Allergies: Grass pollen(k-o-r-t-swt vern)    Review of Systems  Cardiovascular:  Positive for chest pain.    Updated Vital Signs BP (!) 186/98 (BP Location: Left Arm)   Pulse (!) 108   Temp 98 F (36.7 C) (Oral)   Resp 20   SpO2 97%   Physical Exam  (all labs ordered are listed, but only abnormal results are displayed) Labs Reviewed  BASIC METABOLIC PANEL WITH GFR  CBC  TROPONIN T, HIGH SENSITIVITY    EKG: EKG Interpretation Date/Time:  Saturday October 19 2023 23:32:28 EDT Ventricular Rate:  98 PR Interval:  150 QRS Duration:  93 QT Interval:  342 QTC Calculation: 437 R Axis:   66  Text Interpretation: Sinus rhythm Low voltage, precordial leads Confirmed by Nettie, April ([id number]) on 10/19/2023 11:36:22 PM  Radiology: No results found.  {Document cardiac monitor, telemetry assessment procedure  when appropriate:32947} Procedures   Medications Ordered in the ED  methylPREDNISolone  sodium succinate (SOLU-MEDROL ) 125 mg/2 mL injection 125 mg (has no administration in time range)  magnesium  sulfate IVPB 2 g 50 mL (has no administration in time range)  albuterol  (PROVENTIL ,VENTOLIN ) solution continuous neb (has no administration in time range)      {Click here for ABCD2, HEART and other calculators REFRESH Note before signing:1}                              Medical Decision Making Amount and/or Complexity of Data Reviewed Labs:  ordered. Radiology: ordered.  Risk Prescription drug management.   ***  {Document critical care time when appropriate  Document review of labs and clinical decision tools ie CHADS2VASC2, etc  Document your independent review of radiology images and any outside records  Document your discussion with family members, caretakers and with consultants  Document social determinants of health affecting pt's care  Document your decision making why or why not admission, treatments were needed:32947:::1}   Final diagnoses:  None    ED Discharge Orders     None

## 2023-10-20 ENCOUNTER — Other Ambulatory Visit: Payer: Self-pay

## 2023-10-20 ENCOUNTER — Emergency Department (HOSPITAL_COMMUNITY)

## 2023-10-20 DIAGNOSIS — J45901 Unspecified asthma with (acute) exacerbation: Secondary | ICD-10-CM

## 2023-10-20 DIAGNOSIS — J4541 Moderate persistent asthma with (acute) exacerbation: Principal | ICD-10-CM

## 2023-10-20 LAB — CBC
HCT: 45.6 % (ref 39.0–52.0)
HCT: 44.5 % (ref 39.0–52.0)
Hemoglobin: 14.5 g/dL (ref 13.0–17.0)
Hemoglobin: 13.9 g/dL (ref 13.0–17.0)
MCH: 27.6 pg (ref 26.0–34.0)
MCH: 28 pg (ref 26.0–34.0)
MCHC: 31.8 g/dL (ref 30.0–36.0)
MCHC: 31.2 g/dL (ref 30.0–36.0)
MCV: 86.9 fL (ref 80.0–100.0)
MCV: 89.5 fL (ref 80.0–100.0)
Platelets: 237 K/uL (ref 150–400)
Platelets: 223 K/uL (ref 150–400)
RBC: 5.25 MIL/uL (ref 4.22–5.81)
RBC: 4.97 MIL/uL (ref 4.22–5.81)
RDW: 12.9 % (ref 11.5–15.5)
RDW: 13 % (ref 11.5–15.5)
WBC: 11.4 K/uL — ABNORMAL HIGH (ref 4.0–10.5)
WBC: 12.5 K/uL — ABNORMAL HIGH (ref 4.0–10.5)
nRBC: 0 % (ref 0.0–0.2)
nRBC: 0 % (ref 0.0–0.2)

## 2023-10-20 LAB — BASIC METABOLIC PANEL WITH GFR
Anion gap: 11 (ref 5–15)
Anion gap: 17 — ABNORMAL HIGH (ref 5–15)
BUN: 6 mg/dL (ref 6–20)
BUN: 8 mg/dL (ref 6–20)
CO2: 21 mmol/L — ABNORMAL LOW (ref 22–32)
CO2: 27 mmol/L (ref 22–32)
Calcium: 10 mg/dL (ref 8.9–10.3)
Calcium: 9.9 mg/dL (ref 8.9–10.3)
Chloride: 103 mmol/L (ref 98–111)
Chloride: 105 mmol/L (ref 98–111)
Creatinine, Ser: 0.92 mg/dL (ref 0.61–1.24)
Creatinine, Ser: 0.93 mg/dL (ref 0.61–1.24)
GFR, Estimated: >60 mL/min (ref >60)
GFR, Estimated: >60 mL/min (ref >60)
Glucose, Bld: 137 mg/dL — ABNORMAL HIGH (ref 70–99)
Glucose, Bld: 150 mg/dL — ABNORMAL HIGH (ref 70–99)
Potassium: 4.1 mmol/L (ref 3.5–5.1)
Potassium: 4.6 mmol/L (ref 3.5–5.1)
Sodium: 141 mmol/L (ref 135–145)
Sodium: 142 mmol/L (ref 135–145)

## 2023-10-20 LAB — TROPONIN T, HIGH SENSITIVITY: Troponin T High Sensitivity: <15 ng/L (ref 0–19)

## 2023-10-20 LAB — RESP PANEL BY RT-PCR (RSV, FLU A&B, COVID)  RVPGX2
Influenza A by PCR: NEGATIVE
Influenza B by PCR: NEGATIVE
Resp Syncytial Virus by PCR: NEGATIVE
SARS Coronavirus 2 by RT PCR: NEGATIVE

## 2023-10-20 LAB — HIV ANTIBODY (ROUTINE TESTING W REFLEX): HIV Screen 4th Generation wRfx: NONREACTIVE

## 2023-10-20 MED ORDER — IPRATROPIUM-ALBUTEROL 0.5-2.5 (3) MG/3ML IN SOLN
3.0000 mL | Freq: Four times a day (QID) | RESPIRATORY_TRACT | Status: DC
  Administered 2023-10-20: 3 mL via RESPIRATORY_TRACT
  Filled 2023-10-20: qty 3

## 2023-10-20 MED ORDER — ONDANSETRON HCL 4 MG/2ML IJ SOLN: 4.0000 mg | Freq: Four times a day (QID) | INTRAMUSCULAR | Status: DC | PRN

## 2023-10-20 MED ORDER — ENOXAPARIN SODIUM 40 MG/0.4ML IJ SOSY
40.0000 mg | PREFILLED_SYRINGE | INTRAMUSCULAR | Status: DC
  Filled 2023-10-20: qty 0.4

## 2023-10-20 MED ORDER — ACETAMINOPHEN 325 MG PO TABS
650.0000 mg | ORAL_TABLET | Freq: Four times a day (QID) | ORAL | Status: DC | PRN
Start: 2023-10-20 — End: 2023-10-21
  Administered 2023-10-20 (×2): 650 mg via ORAL
  Filled 2023-10-20 (×2): qty 2

## 2023-10-20 MED ORDER — ENOXAPARIN SODIUM 80 MG/0.8ML IJ SOSY
80.0000 mg | PREFILLED_SYRINGE | INTRAMUSCULAR | Status: DC
  Filled 2023-10-20: qty 0.8

## 2023-10-20 MED ORDER — ONDANSETRON HCL 4 MG PO TABS: 4.0000 mg | ORAL_TABLET | Freq: Four times a day (QID) | ORAL | Status: DC | PRN

## 2023-10-20 MED ORDER — ACETAMINOPHEN 650 MG RE SUPP: 650.0000 mg | Freq: Four times a day (QID) | RECTAL | Status: DC | PRN

## 2023-10-20 MED ORDER — CHLORTHALIDONE 25 MG PO TABS
25.0000 mg | ORAL_TABLET | Freq: Every day | ORAL | Status: DC
  Administered 2023-10-20 – 2023-10-21 (×2): 25 mg via ORAL
  Filled 2023-10-20 (×2): qty 1

## 2023-10-20 MED ORDER — MONTELUKAST SODIUM 10 MG PO TABS
10.0000 mg | ORAL_TABLET | Freq: Every day | ORAL | Status: DC
  Administered 2023-10-20 – 2023-10-21 (×2): 10 mg via ORAL
  Filled 2023-10-20 (×2): qty 1

## 2023-10-20 MED ORDER — IPRATROPIUM-ALBUTEROL 0.5-2.5 (3) MG/3ML IN SOLN
3.0000 mL | Freq: Four times a day (QID) | RESPIRATORY_TRACT | Status: DC
  Administered 2023-10-20 – 2023-10-21 (×6): 3 mL via RESPIRATORY_TRACT
  Filled 2023-10-20 (×6): qty 3

## 2023-10-20 MED ORDER — ALBUTEROL SULFATE (2.5 MG/3ML) 0.083% IN NEBU: 2.5000 mg | INHALATION_SOLUTION | RESPIRATORY_TRACT | Status: DC | PRN

## 2023-10-20 MED ORDER — IPRATROPIUM-ALBUTEROL 0.5-2.5 (3) MG/3ML IN SOLN: 3.0000 mL | Freq: Three times a day (TID) | RESPIRATORY_TRACT | Status: DC

## 2023-10-20 MED ORDER — ALBUTEROL SULFATE (2.5 MG/3ML) 0.083% IN NEBU
5.0000 mg | INHALATION_SOLUTION | Freq: Once | RESPIRATORY_TRACT | Status: AC
  Administered 2023-10-20: 5 mg via RESPIRATORY_TRACT
  Filled 2023-10-20: qty 6

## 2023-10-20 MED ORDER — METHYLPREDNISOLONE SODIUM SUCC 40 MG IJ SOLR
40.0000 mg | Freq: Two times a day (BID) | INTRAMUSCULAR | Status: DC
  Administered 2023-10-20 – 2023-10-21 (×3): 40 mg via INTRAVENOUS
  Filled 2023-10-20 (×3): qty 1

## 2023-10-20 MED ORDER — HYDRALAZINE HCL 20 MG/ML IJ SOLN
5.0000 mg | Freq: Four times a day (QID) | INTRAMUSCULAR | Status: DC | PRN
  Administered 2023-10-20: 5 mg via INTRAVENOUS
  Filled 2023-10-20: qty 1

## 2023-10-20 NOTE — ED Notes (Signed)
 Writer called [facility] and notified unit that patient would be coming up to them shortly.

## 2023-10-20 NOTE — Plan of Care (Signed)
   Problem: Activity: Goal: Risk for activity intolerance will decrease Outcome: Progressing   Problem: Coping: Goal: Level of anxiety will decrease Outcome: Progressing   Problem: Pain Managment: Goal: General experience of comfort will improve and/or be controlled Outcome: Progressing

## 2023-10-20 NOTE — H&P (Addendum)
 History and Physical  Cody Ward FMW:[id number] DOB: [birth year] DOA: 10/19/2023  PCP: Celestia Rosaline SQUIBB, NP   Chief Complaint: Chest tightness, cough, wheezing  HPI: Cody Ward is a 28 y.o. male with medical history significant for anxiety, severe persistent asthma on daily Spiriva  being admitted to the hospital with 1 day of cough, chest tightness and wheezing due to asthma exacerbation.  Patient denies any sick contacts, but a month ago he was treated for sinusitis and otitis media.  Overall has been doing well, takes Spiriva  daily, has not followed up with his pulmonologist Dr. Kara in a couple of years.  Starting in the last 24 to 36 hours, he started having cough, wheezing, chest pressure and tightness.  Was only able to speak 2-3 words at a time, now feeling much better after IV steroids, magnesium , continuous nebulizer.  Workup as detailed below is quite benign, without evidence of acute infection.  Review of Systems: Please see HPI for pertinent positives and negatives. A complete 10 system review of systems are otherwise negative.  Past Medical History:  Diagnosis Date   Asthma    History reviewed. No pertinent surgical history. Social History:  reports that he has never smoked. He has never been exposed to tobacco smoke. He has never used smokeless tobacco. No history on file for alcohol use and drug use.  Allergies  Allergen Reactions   Grass Pollen(K-O-R-T-Swt Vern) Cough    Rash and cough    Family History  Family history unknown: Yes     Prior to Admission medications   Medication Sig Start Date End Date Taking? Authorizing Provider  albuterol  (PROVENTIL ) (2.5 MG/3ML) 0.083% nebulizer solution Take 3 mLs (2.5 mg total) by nebulization every 6 (six) hours as needed for wheezing or shortness of breath. 09/19/21   Kara Dorn NOVAK, MD  albuterol  (VENTOLIN  HFA) 108 [phone] Base) MCG/ACT inhaler Inhale 2 puffs into the lungs every 6 (six) hours as needed for wheezing or  shortness of breath. 06/15/21   Kara Dorn NOVAK, MD  Albuterol  Sulfate (PROAIR  RESPICLICK) 108 (90 Base) MCG/ACT AEPB Inhale 1-2 puffs into the lungs every 6 (six) hours as needed. Patient not taking: Reported on 10/11/2021 06/14/21 11/11/21  Kara Dorn NOVAK, MD  buPROPion  (WELLBUTRIN  SR) 150 MG 12 hr tablet Take 1 tablet (150 mg total) by mouth 2 (two) times daily. Take 1 tablet daily for the first 7 days, then 1 tablet twice daily there after 06/01/21   Kara Dorn NOVAK, MD  fluticasone  (FLONASE ) 50 MCG/ACT nasal spray Place 1 spray into both nostrils daily. 08/02/21   Kara Dorn NOVAK, MD  fluticasone -salmeterol (WIXELA INHUB ) 500-50 MCG/ACT AEPB INHALE 1 PUFF INTO THE LUNGS IN THE MORNING AND AT BEDTIME. 07/31/22   Kara Dorn NOVAK, MD  magnesium  30 MG tablet Take 30 mg by mouth every evening.    [provider]  montelukast  (SINGULAIR ) 10 MG tablet Take 1 tablet (10 mg total) by mouth at bedtime. 08/02/21   Kara Dorn NOVAK, MD  riboflavin (VITAMIN B-2) 100 MG TABS tablet Take 100 mg by mouth every evening. Patient not taking: Reported on 10/11/2021    [provider]    Physical Exam: BP (!) 169/87   Pulse (!) 103   Temp 98 F (36.7 C) (Oral)   Resp 13   SpO2 99%  General:  Alert, oriented, calm, in no acute distress, wearing 2 L nasal cannula oxygen.  His fiance is at the bedside.  Patient is currently speaking in full  sentences comfortably, and has no cough. Cardiovascular: RRR, no murmurs or rubs, no peripheral edema  Respiratory: Breath sounds are quite distant, without rhonchi, tachypnea, he has minimal end expiratory wheezing in all lung fields Abdomen: soft, nontender, nondistended, normal bowel tones heard  Skin: dry, no rashes  Musculoskeletal: no joint effusions, normal range of motion  Psychiatric: appropriate affect, normal speech  Neurologic: extraocular muscles intact, clear speech, moving all extremities with intact sensorium         Labs on  Admission:  Basic Metabolic Panel: Recent Labs  Lab 10/19/23 2337  NA 141  K 4.1  CL 103  CO2 27  GLUCOSE 137*  BUN 6  CREATININE 0.92  CALCIUM 10.0   Liver Function Tests: No results for input(s): AST, ALT, ALKPHOS, BILITOT, PROT, ALBUMIN in the last 168 hours. No results for input(s): LIPASE, AMYLASE in the last 168 hours. No results for input(s): AMMONIA in the last 168 hours. CBC: Recent Labs  Lab 10/19/23 2337  WBC 11.4*  HGB 14.5  HCT 45.6  MCV 86.9  PLT 237   Cardiac Enzymes: No results for input(s): CKTOTAL, CKMB, CKMBINDEX, TROPONINI in the last 168 hours. BNP (last 3 results) Recent Labs    01/05/23 1607  BNP 32.6    ProBNP (last 3 results) No results for input(s): PROBNP in the last 8760 hours.  CBG: No results for input(s): GLUCAP in the last 168 hours.  Radiological Exams on Admission: DG Chest 2 View Result Date: 10/20/2023 EXAM: 2 VIEW(S) XRAY OF THE CHEST 10/20/2023 01:29:00 AM COMPARISON: 01/05/2023 CLINICAL HISTORY: cp. Pt states that he has been having central CP and SOB all day, hx of asthma unrelieved by home relief meds. Pt wheezing, speaking in short sentences. FINDINGS: LUNGS AND PLEURA: No focal pulmonary opacity. No pulmonary edema. No pleural effusion. No pneumothorax. HEART AND MEDIASTINUM: No acute abnormality of the cardiac and mediastinal silhouettes. BONES AND SOFT TISSUES: No acute osseous abnormality. IMPRESSION: 1. No acute process. Electronically signed by: Norman Gatlin MD 10/20/2023 01:33 AM EDT RP Workstation: [id number]   Assessment/Plan Cody Ward is a 28 y.o. male with medical history significant for anxiety, severe persistent asthma on daily Spiriva  being admitted to the hospital with 1 day of cough, chest tightness and wheezing due to asthma exacerbation.  Asthma exacerbation-with cough, dyspnea, chest tightness and pressure which have all improved with the usual treatments. -Observation  admission -Continue IV Solu-Medrol  -Scheduled DuoNebs -As needed albuterol  neb -Patient encouraged to follow-up with outpatient pulmonology  Anxiety-was taking Wellbutrin  in the past, he no longer takes this medication  Hypertension-no prior history of this, likely due to respiratory discomfort and steroids. -Monitor -IV hydralazine  as needed  DVT prophylaxis: Lovenox      Code Status: Full Code  Consults called: None  Admission status: Observation  Time spent: 49 minutes  Rosi Secrist CHRISTELLA Gail MD [facility] Pager [phone]  If 7PM-7AM, please contact night-coverage [email or web address] Password [email or web address]  10/20/2023, 2:10 AM

## 2023-10-20 NOTE — Progress Notes (Addendum)
 PROGRESS NOTE    Cody Ward  FMW:[id number] DOB: 1995/04/05 DOA: 10/19/2023 PCP: Celestia Rosaline SQUIBB, NP  Outpatient Specialists:     Brief Narrative:  Patient is a 28 year old male with past medical history significant for asthma since childhood.  Patient has never been intubated.  Patient is also morbidly obese, with BMI of 50.77 kg/m.  Previous A1c was indicative of prediabetes.  Patient was admitted with asthma exacerbation.  Blood pressure has been noted to be elevated.  No prior diagnosis of hypertension.   Assessment & Plan:   Principal Problem:   Asthma exacerbation   Asthma exacerbation: - Continue IV Solu-Medrol  and DuoNeb. - As needed albuterol . - Start Singulair  10 mg p.o. once daily. - Peak flow daily. - Further management will depend on hospital course.  Morbid obesity: - Diet and exercise. - PCP to consider Ozempic or Mounjaro on discharge i.e. if not contraindicated.  Elevated blood pressure: - Suspect previously undiagnosed hypertension. -Low threshold to start diuretics.  Borderline/prediabetes: - Repeat A1c.   DVT prophylaxis: Subcutaneous Lovenox  Code Status: Full code. Family Communication:  Disposition Plan: Home eventually.   Consultants:  None.  Procedures:  None.  Antimicrobials:  None.   Subjective: Shortness of breath and wheezing improving  Objective: Vitals:   10/20/23 0532 10/20/23 0808 10/20/23 0949 10/20/23 1144  BP: (!) 172/81  (!) 167/73   Pulse: 79  80   Resp: 18  17   Temp: 97.8 F (36.6 C)  97.8 F (36.6 C)   TempSrc:   Oral   SpO2: 94% 94% 96% 94%  Weight:      Height:        Intake/Output Summary (Last 24 hours) at 10/20/2023 1223 Last data filed at 10/20/2023 0534 Gross per 24 hour  Intake 382.09 ml  Output --  Net 382.09 ml   Filed Weights   10/20/23 0230  Weight: (!) 160.5 kg    Examination:  General exam: Appears calm and comfortable.  Patient is morbidly obese. Respiratory system:  Decreased air entry.  Inspiratory wheeze.   Cardiovascular system: S1 & S2 heard Gastrointestinal system: Abdomen is obese, soft and nontender.  Central nervous system: Alert and oriented.   Extremities: Leg edema.  Data Reviewed: I have personally reviewed following labs and imaging studies  CBC: Recent Labs  Lab 10/19/23 2337 10/20/23 0341  WBC 11.4* 12.5*  HGB 14.5 13.9  HCT 45.6 44.5  MCV 86.9 89.5  PLT 237 223   Basic Metabolic Panel: Recent Labs  Lab 10/19/23 2337 10/20/23 0341  NA 141 142  K 4.1 4.6  CL 103 105  CO2 27 21*  GLUCOSE 137* 150*  BUN 6 8  CREATININE 0.92 0.93  CALCIUM 10.0 9.9   GFR: Estimated Creatinine Clearance: 179 mL/min (by C-G formula based on SCr of 0.93 mg/dL). Liver Function Tests: No results for input(s): AST, ALT, ALKPHOS, BILITOT, PROT, ALBUMIN in the last 168 hours. No results for input(s): LIPASE, AMYLASE in the last 168 hours. No results for input(s): AMMONIA in the last 168 hours. Coagulation Profile: No results for input(s): INR, PROTIME in the last 168 hours. Cardiac Enzymes: No results for input(s): CKTOTAL, CKMB, CKMBINDEX, TROPONINI in the last 168 hours. BNP (last 3 results) No results for input(s): PROBNP in the last 8760 hours. HbA1C: No results for input(s): HGBA1C in the last 72 hours. CBG: No results for input(s): GLUCAP in the last 168 hours. Lipid Profile: No results for input(s): CHOL, HDL, LDLCALC, TRIG, CHOLHDL, LDLDIRECT in  the last 72 hours. Thyroid Function Tests: No results for input(s): TSH, T4TOTAL, FREET4, T3FREE, THYROIDAB in the last 72 hours. Anemia Panel: No results for input(s): VITAMINB12, FOLATE, FERRITIN, TIBC, IRON, RETICCTPCT in the last 72 hours. Urine analysis:    Component Value Date/Time   COLORURINE YELLOW 01/05/2023 1700   APPEARANCEUR CLEAR 01/05/2023 1700   LABSPEC 1.019 01/05/2023 1700   PHURINE 6.0 01/05/2023  1700   GLUCOSEU NEGATIVE 01/05/2023 1700   HGBUR SMALL (A) 01/05/2023 1700   BILIRUBINUR NEGATIVE 01/05/2023 1700   KETONESUR NEGATIVE 01/05/2023 1700   PROTEINUR NEGATIVE 01/05/2023 1700   NITRITE NEGATIVE 01/05/2023 1700   LEUKOCYTESUR NEGATIVE 01/05/2023 1700   Sepsis Labs: @LABRCNTIP (procalcitonin:4,lacticidven:4)  ) Recent Results (from the past 240 hours)  Resp panel by RT-PCR (RSV, Flu A&B, Covid) Anterior Nasal Swab     Status: None   Collection Time: 10/19/23 11:37 PM   Specimen: Anterior Nasal Swab  Result Value Ref Range Status   SARS Coronavirus 2 by RT PCR NEGATIVE NEGATIVE Final    Comment: (NOTE) SARS-CoV-2 target nucleic acids are NOT DETECTED.  The SARS-CoV-2 RNA is generally detectable in upper respiratory specimens during the acute phase of infection. The lowest concentration of SARS-CoV-2 viral copies this assay can detect is 138 copies/mL. A negative result does not preclude SARS-Cov-2 infection and should not be used as the sole basis for treatment or other patient management decisions. A negative result may occur with  improper specimen collection/handling, submission of specimen other than nasopharyngeal swab, presence of viral mutation(s) within the areas targeted by this assay, and inadequate number of viral copies(<138 copies/mL). A negative result must be combined with clinical observations, patient history, and epidemiological information. The expected result is Negative.  Fact Sheet for Patients:  BloggerCourse.com  Fact Sheet for Healthcare Providers:  SeriousBroker.it  This test is no t yet approved or cleared by the [place]  FDA and  has been authorized for detection and/or diagnosis of SARS-CoV-2 by FDA under an Emergency Use Authorization (EUA). This EUA will remain  in effect (meaning this test can be used) for the duration of the COVID-19 declaration under Section 564(b)(1) of the  Act, 21 U.S.C.section 360bbb-3(b)(1), unless the authorization is terminated  or revoked sooner.       Influenza A by PCR NEGATIVE NEGATIVE Final   Influenza B by PCR NEGATIVE NEGATIVE Final    Comment: (NOTE) The Xpert Xpress SARS-CoV-2/FLU/RSV plus assay is intended as an aid in the diagnosis of influenza from Nasopharyngeal swab specimens and should not be used as a sole basis for treatment. Nasal washings and aspirates are unacceptable for Xpert Xpress SARS-CoV-2/FLU/RSV testing.  Fact Sheet for Patients: BloggerCourse.com  Fact Sheet for Healthcare Providers: SeriousBroker.it  This test is not yet approved or cleared by the [place]  FDA and has been authorized for detection and/or diagnosis of SARS-CoV-2 by FDA under an Emergency Use Authorization (EUA). This EUA will remain in effect (meaning this test can be used) for the duration of the COVID-19 declaration under Section 564(b)(1) of the Act, 21 U.S.C. section 360bbb-3(b)(1), unless the authorization is terminated or revoked.     Resp Syncytial Virus by PCR NEGATIVE NEGATIVE Final    Comment: (NOTE) Fact Sheet for Patients: BloggerCourse.com  Fact Sheet for Healthcare Providers: SeriousBroker.it  This test is not yet approved or cleared by the [place]  FDA and has been authorized for detection and/or diagnosis of SARS-CoV-2 by FDA under an Emergency Use Authorization (EUA). This EUA will remain  in effect (meaning this test can be used) for the duration of the COVID-19 declaration under Section 564(b)(1) of the Act, 21 U.S.C. section 360bbb-3(b)(1), unless the authorization is terminated or revoked.  Performed at [facility], [address], [place], [place] [address]          Radiology Studies: DG Chest 2 View Result Date: 10/20/2023 EXAM: 2 VIEW(S) XRAY OF THE CHEST 10/20/2023  01:29:00 AM COMPARISON: 01/05/2023 CLINICAL HISTORY: cp. Pt states that he has been having central CP and SOB all day, hx of asthma unrelieved by home relief meds. Pt wheezing, speaking in short sentences. FINDINGS: LUNGS AND PLEURA: No focal pulmonary opacity. No pulmonary edema. No pleural effusion. No pneumothorax. HEART AND MEDIASTINUM: No acute abnormality of the cardiac and mediastinal silhouettes. BONES AND SOFT TISSUES: No acute osseous abnormality. IMPRESSION: 1. No acute process. Electronically signed by: Norman Gatlin MD 10/20/2023 01:33 AM EDT RP Workstation: [id number]        Scheduled Meds:  enoxaparin  (LOVENOX ) injection  40 mg Subcutaneous Q24H   ipratropium-albuterol   3 mL Nebulization QID   methylPREDNISolone  (SOLU-MEDROL ) injection  40 mg Intravenous Q12H   Continuous Infusions:   LOS: 0 days    Time spent: 35 minutes    Leatrice Chapel, MD  [facility] Pager #: [phone] 7PM-7AM contact night coverage as above

## 2023-10-21 ENCOUNTER — Other Ambulatory Visit (HOSPITAL_COMMUNITY): Payer: Self-pay

## 2023-10-21 DIAGNOSIS — J4521 Mild intermittent asthma with (acute) exacerbation: Secondary | ICD-10-CM | POA: Diagnosis not present

## 2023-10-21 LAB — CBC WITH DIFFERENTIAL/PLATELET
Abs Immature Granulocytes: 0.18 K/uL — ABNORMAL HIGH (ref 0.00–0.07)
Basophils Absolute: 0 K/uL (ref 0.0–0.1)
Basophils Relative: 0 %
Eosinophils Absolute: 0 K/uL (ref 0.0–0.5)
Eosinophils Relative: 0 %
HCT: 45.6 % (ref 39.0–52.0)
Hemoglobin: 13.7 g/dL (ref 13.0–17.0)
Immature Granulocytes: 1 %
Lymphocytes Relative: 3 %
Lymphs Abs: 0.8 K/uL (ref 0.7–4.0)
MCH: 26.7 pg (ref 26.0–34.0)
MCHC: 30 g/dL (ref 30.0–36.0)
MCV: 88.7 fL (ref 80.0–100.0)
Monocytes Absolute: 0.9 K/uL (ref 0.1–1.0)
Monocytes Relative: 4 %
Neutro Abs: 20.4 K/uL — ABNORMAL HIGH (ref 1.7–7.7)
Neutrophils Relative %: 92 %
Platelets: 271 K/uL (ref 150–400)
RBC: 5.14 MIL/uL (ref 4.22–5.81)
RDW: 13.4 % (ref 11.5–15.5)
WBC: 22.2 K/uL — ABNORMAL HIGH (ref 4.0–10.5)
nRBC: 0 % (ref 0.0–0.2)

## 2023-10-21 LAB — HEMOGLOBIN A1C
Hgb A1c MFr Bld: 5.8 % — ABNORMAL HIGH (ref 4.8–5.6)
Mean Plasma Glucose: 119.76 mg/dL

## 2023-10-21 LAB — RENAL FUNCTION PANEL
Albumin: 4.3 g/dL (ref 3.5–5.0)
Anion gap: 11 (ref 5–15)
BUN: 12 mg/dL (ref 6–20)
CO2: 24 mmol/L (ref 22–32)
Calcium: 10.4 mg/dL — ABNORMAL HIGH (ref 8.9–10.3)
Chloride: 104 mmol/L (ref 98–111)
Creatinine, Ser: 0.62 mg/dL (ref 0.61–1.24)
GFR, Estimated: >60 mL/min (ref >60)
Glucose, Bld: 134 mg/dL — ABNORMAL HIGH (ref 70–99)
Phosphorus: 2.8 mg/dL (ref 2.5–4.6)
Potassium: 4.8 mmol/L (ref 3.5–5.1)
Sodium: 139 mmol/L (ref 135–145)

## 2023-10-21 MED ORDER — FLUTICASONE-SALMETEROL 250-50 MCG/ACT IN AEPB
1.0000 | INHALATION_SPRAY | Freq: Two times a day (BID) | RESPIRATORY_TRACT | 1 refills | Status: AC
  Filled 2023-10-21 (×4): qty 60, 30d supply, fill #0

## 2023-10-21 MED ORDER — MONTELUKAST SODIUM 10 MG PO TABS
10.0000 mg | ORAL_TABLET | Freq: Every day | ORAL | 0 refills | Status: AC
  Filled 2023-10-21: qty 30, 30d supply, fill #0

## 2023-10-21 MED ORDER — PREDNISONE 10 MG PO TABS
ORAL_TABLET | ORAL | 0 refills | Status: AC
  Filled 2023-10-21: qty 48, 12d supply, fill #0

## 2023-10-21 MED ORDER — CHLORTHALIDONE 25 MG PO TABS
25.0000 mg | ORAL_TABLET | Freq: Every day | ORAL | 0 refills | Status: AC
  Filled 2023-10-21: qty 30, 30d supply, fill #0

## 2023-10-21 MED ORDER — ALBUTEROL SULFATE HFA 108 (90 BASE) MCG/ACT IN AERS
2.0000 | INHALATION_SPRAY | Freq: Four times a day (QID) | RESPIRATORY_TRACT | 2 refills | Status: AC | PRN
  Filled 2023-10-21: qty 6.7, 30d supply, fill #0

## 2023-10-21 NOTE — Progress Notes (Signed)
 Discharge meds in a secure bag delivered to pt in room by this RN. Pt stated he can not afford $35 foAdvair  at this time and stated he would follow up w/ PCP for an alternative in am - Advair  not available at [facility] until 9/9- updated by this RN that pt would follow up w/ his PC for an alternative- Pt requested a return to work note- request sent to Dr Rosario via secure chat

## 2023-10-21 NOTE — Progress Notes (Signed)
 Peal flow 290/410 pt had good effort.

## 2023-10-21 NOTE — Progress Notes (Signed)
 Discharge medication includes Advair  which has a copay of $150 which pt stated he could not afford. MATCH is not available due to pt being insured. Dr Rosario notified via secure chat - alternative option requested.

## 2023-10-21 NOTE — Plan of Care (Signed)

## 2023-10-21 NOTE — Progress Notes (Signed)
   10/21/23 1057  TOC Brief Assessment  Insurance and Status Reviewed  Patient has primary care physician Yes  Home environment has been reviewed resides in private residence  Prior level of function: Independent  Prior/Current Home Services No current home services  Social Drivers of Health Review SDOH reviewed no interventions necessary  Readmission risk has been reviewed Yes  Transition of care needs no transition of care needs at this time

## 2023-10-21 NOTE — Discharge Summary (Signed)
 Physician Discharge Summary  Patient ID: Cody Ward MRN: [id number] DOB/AGE: 1996/02/12 28 y.o.  Admit date: 10/19/2023 Discharge date: 10/21/2023  Admission Diagnoses:  Discharge Diagnoses:  Principal Problem:   Asthma exacerbation   Discharged Condition: {condition:18240}  Hospital Course: ***  Consults: {consultation:18241}  Significant Diagnostic Studies: {diagnostics:18242}  Treatments: {Tx:18249}  Discharge Exam: Blood pressure 135/71, pulse 70, temperature 97.9 F (36.6 C), temperature source Oral, resp. rate 18, height 5' 10 (1.778 m), weight (!) 160.5 kg, SpO2 96%. {physical zkjf:6958869}  Disposition: Discharge disposition: 01-Home or Self Care       Discharge Instructions     Diet - low sodium heart healthy   Complete by: As directed    Increase activity slowly   Complete by: As directed       Allergies as of 10/21/2023       Reactions   Grass Pollen(k-o-r-t-swt Vern) Cough   Rash and cough        Medication List     STOP taking these medications    magnesium  30 MG tablet   Spiriva  Respimat 2.5 MCG/ACT Aers Generic drug: Tiotropium Bromide Monohydrate        TAKE these medications    albuterol  108 (90 Base) MCG/ACT inhaler Commonly known as: VENTOLIN  HFA Inhale 2 puffs into the lungs every 6 (six) hours as needed for wheezing or shortness of breath.   chlorthalidone  25 MG tablet Commonly known as: HYGROTON  Take 1 tablet (25 mg total) by mouth daily. Start taking on: October 22, 2023   fluticasone  50 MCG/ACT nasal spray Commonly known as: FLONASE  Place 1 spray into both nostrils daily.   fluticasone -salmeterol 250-50 MCG/ACT Aepb Commonly known as: Advair  Diskus Inhale 1 puff into the lungs in the morning and at bedtime.   montelukast  10 MG tablet Commonly known as: SINGULAIR  Take 1 tablet (10 mg total) by mouth daily in the afternoon.   predniSONE  10 MG tablet Commonly known as: DELTASONE  Prednisone  60 mg p.o. once  daily for 3 days, then 40 mg p.o. once daily for 3 days, then 30 mg p.o. once daily for 3 days, then 20 mg p.o. once daily for 3 days and then 10 mg p.o. once daily for 3 days and stop         Signed: Leatrice LILLETTE Chapel 10/21/2023, 3:36 PM

## 2023-10-22 ENCOUNTER — Other Ambulatory Visit (HOSPITAL_COMMUNITY): Payer: Self-pay

## 2023-10-22 ENCOUNTER — Telehealth: Payer: Self-pay

## 2023-10-22 NOTE — Transitions of Care (Post Inpatient/ED Visit) (Signed)
   10/22/2023  Name: Cody Ward MRN: [id number] DOB: [birth year]  Today's TOC FU Call Status: Today's TOC FU Call Status:: Unsuccessful Call (1st Attempt) Unsuccessful Call (1st Attempt) Date: 10/22/23  Attempted to reach the patient regarding the most recent Inpatient/ED visit.  Follow Up Plan: Additional outreach attempts will be made to reach the patient to complete the Transitions of Care (Post Inpatient/ED visit) call.   Signature  Slater Diesel, RN

## 2023-10-23 ENCOUNTER — Telehealth: Payer: Self-pay

## 2023-10-23 NOTE — Transitions of Care (Post Inpatient/ED Visit) (Signed)
   10/23/2023  Name: Cody Ward MRN: [id number] DOB: 21-Sep-1995  Today's TOC FU Call Status: Today's TOC FU Call Status:: Unsuccessful Call (2nd Attempt) Unsuccessful Call (1st Attempt) Date: 10/22/23 Unsuccessful Call (2nd Attempt) Date: 10/23/23  Attempted to reach the patient regarding the most recent Inpatient/ED visit.  Follow Up Plan: Additional outreach attempts will be made to reach the patient to complete the Transitions of Care (Post Inpatient/ED visit) call.   Signature  Slater Diesel, RN

## 2023-10-24 ENCOUNTER — Telehealth: Payer: Self-pay

## 2023-10-24 NOTE — Transitions of Care (Post Inpatient/ED Visit) (Signed)
   10/24/2023  Name: Cody Ward MRN: [id number] DOB: 1996/02/07  Today's TOC FU Call Status: Today's TOC FU Call Status:: Unsuccessful Call (3rd Attempt) Unsuccessful Call (1st Attempt) Date: 10/22/23 Unsuccessful Call (2nd Attempt) Date: 10/23/23 Unsuccessful Call (3rd Attempt) Date: 10/24/23  Attempted to reach the patient regarding the most recent Inpatient/ED visit.  Follow Up Plan: No further outreach attempts will be made at this time. We have been unable to contact the patient.  Letter sent to patient requesting he contact [facility] to schedule an appointment as we have not been able to reach him.   Signature  Slater Diesel, RN

## 2023-11-29 ENCOUNTER — Ambulatory Visit: Payer: Self-pay | Admitting: Pulmonary Disease

## 2023-11-29 NOTE — Telephone Encounter (Signed)
 FYI Only or Action Required?: Action required by provider: medication refill request.  Patient is followed in Pulmonology for asthma, last seen on 10/11/2021 by Kara Dorn NOVAK, MD.  Called Nurse Triage reporting Shortness of Breath.  Symptoms began several days ago.  Interventions attempted: Rescue inhaler, Maintenance inhaler, and Nebulizer treatments.  Symptoms are: gradually worsening.  Triage Disposition: See PCP When Office is Open (Within 3 Days)  Patient/caregiver understands and will follow disposition?: Yes   Copied from CRM #[id number]. Topic: Clinical - Red Word Triage >> Nov 29, 2023  2:45 PM Leila BROCKS wrote: Red Word that prompted transfer to Nurse Triage: Patient [phone] states asthma and breathing issues is flaring up and needs to schedule an appointment. Patient states more coughing, wheezing, fatigue, cannot catch his breath using the inhaler more 2-3 times a day or more. Patient went to [facility] ER and was admitted for a few days, given steroids-Prednisone , and albuterol  inhaler helped but not symptoms has returned. Patient is unsure of the names of medications, but needs refill it's a daily inhaler, nebulizer treatment medication and montelukast  (SINGULAIR ) 10 MG tablet. Patient last saw Dr. Kara 10/11/21. Please advise.   [id number] - [place], [place] - [address] [place] [address] Phone: [phone] Fax: [phone] Reason for Disposition  [1] MODERATE longstanding difficulty breathing (e.g., speaks in phrases, SOB even at rest, pulse 100-120) AND [2] SAME as normal  Answer Assessment - Initial Assessment Questions Pt states approx 2 days had asthma flare up and his inhaler isn't helping. Pt becomes winded when speaking in short sentences. Cough/wheeze/fatigue. Seen in ER and admitted recently. Given steroids and inhalers. Pt needs meds refilled; prednisone /albuterol  inhaler/montelukast . Med orders pended. Also requesting  nebulizer solution, states that helps him the most. Appointment made for [facility] Oct 20  1. RESPIRATORY STATUS: Describe your breathing? (e.g., wheezing, shortness of breath, unable to speak, severe coughing)      Sob when talking; wheezing 2. ONSET: When did this breathing problem begin?      2 days ago 3. PATTERN Does the difficult breathing come and go, or has it been constant since it started?      Most of the time, but worse with talking and exertion 4. SEVERITY: How bad is your breathing? (e.g., mild, moderate, severe)      moderate 5. RECURRENT SYMPTOM: Have you had difficulty breathing before? If Yes, ask: When was the last time? and What happened that time?      Yes; asthma flare up 6. CARDIAC HISTORY: Do you have any history of heart disease? (e.g., heart attack, angina, bypass surgery, angioplasty)      denies 7. LUNG HISTORY: Do you have any history of lung disease?  (e.g., pulmonary embolus, asthma, emphysema)     asthma 8. CAUSE: What do you think is causing the breathing problem?      asthma 9. OTHER SYMPTOMS: Do you have any other symptoms? (e.g., chest pain, cough, dizziness, fever, runny nose)     Wheezing; cough; back pain 10. O2 SATURATION MONITOR:  Do you use an oxygen saturation monitor (pulse oximeter) at home? If Yes, ask: What is your reading (oxygen level) today? What is your usual oxygen saturation reading? (e.g., 95%)       N/A  12. TRAVEL: Have you traveled out of the country in the last month? (e.g., travel history, exposures)       denies  Protocols used: Breathing Difficulty-A-AH

## 2023-12-02 ENCOUNTER — Ambulatory Visit: Admitting: Pulmonary Disease

## 2023-12-02 NOTE — Telephone Encounter (Signed)
 Acute visit 10/20.

## 2023-12-03 ENCOUNTER — Ambulatory Visit: Admitting: Primary Care

## 2023-12-03 ENCOUNTER — Encounter: Payer: Self-pay | Admitting: Primary Care

## 2023-12-03 NOTE — Progress Notes (Deleted)
 @Patient  ID: Cody Ward, male    DOB: 09-09-1995, 28 y.o.   MRN: [id number]  No chief complaint on file.   Referring provider: Celestia Rosaline SQUIBB, NP  HPI: 28 year old male, daily vaper who returns to pulmonary clinic for follow up of right pleural effusion.   Previous [facility] pulmonary encounter: OV 09/19/21 He started to have increased dyspnea, coughing fits, and right central lower chest discomfort that then wrapped around his back about 2 weeks ago. He does report intermittent chills. Denies fevers. He feels exhausted. Had sinus symptoms that went into his chest and then pain started. He also reports diffuse muscle pains and cramps. He also reports long standing joint stiffness/pain in hands that has been worse recently.   His mother has mixed connective tissue disease and RA. He reports his sister was recently diagnosed with an autoimmune disease.  Chest radiograph today shows right pleural effusion. He is ok with having thoracentesis performed.  OV 08/02/21 He has been out of his spiriva  and dulera  over the past couple of weeks and has noticed increased shortness of breath, cough and wheezing. He is using albuerol nebs and inhaler as needed. He received prednisone  taper from urgent care 2 weeks ago which has helped.   He is vaping much less than before, about two to three times per day.   He started wellbutrin  after last visit and is taking half tab per day. He did not tolerate the full tablet but reports his anxiety is much better.  PFTs today show mild obstruction, significant bronchodilator response and air trapping.   Initial Visit 06/01/21 He was admitted 1/14 to 1/16 for asthma exacerbation where he was seen by Dr. Geronimo in consult. Absolute eosinophil count was 2,000 on 1/14 and total IgE leve was 442. He has significant allergies to grasses based on allergy panel and mild allergies to pet dander. He followed up with Izetta Rouleau, NP in clinic on 1/19 after being discharged on  dulera  200-27mcg 2 puffs twice daily and feeling much better at time of visit. He was completing a 15 day prednisone  taper. He was prescribed singulair  at that visit.  He was seen by Dr. Brien on 05/02/21. He never started singulair . His inhaler technique was appropriate and was given a spacer. He was treated with augmentin  for 10 days for sinusitis and started on fluticasone  nasal spray. He was started on pantoprazole for GERD.   He is a Production designer, theatre/television/film at [employer]. Vaping daily, he is vaping every 2 hours. He stopped smoking for vaping. Vaping for 2-4 years. Smoked cigarettes for 2 years before. He has cut back on the amount of vape he is using. He noticed an increase in his asthma symptoms over the past 2 years.  He reports increasing chest tightness, cough and intermittent wheezing since coming off the steroids. He has not picked up the montelukast  yet.   He has history of childhood asthma.      12/03/2023 Discussed the use of AI scribe software for clinical note transcription with the patient, who gave verbal consent to proceed.  History of Present Illness  Shortness of breath began a few days ago. Associated coughing, wheezing and fatigue Admitted in September for asthma exacerbation, treated with steroids and inhalers Hx asthma followed by Dr. Kara, last seen on 10/12/23 Using inhaler 2-3 times  day   ACT score How often does asthma flare  FENO? LABS? Maintenace? Dep? Biologic?       Allergies  Allergen Reactions  Grass Pollen([id number]-Swt Vern) Cough    Rash and cough    Immunization History  Administered Date(s) Administered   Influenza Split 01/13/2013    Past Medical History:  Diagnosis Date   Asthma     Tobacco History: Social History   Tobacco Use  Smoking Status Never   Passive exposure: Never  Smokeless Tobacco Never   Counseling given: Not Answered   Outpatient Medications Prior to Visit  Medication Sig Dispense Refill   albuterol  (VENTOLIN   HFA) 108 (90 Base) MCG/ACT inhaler Inhale 2 puffs into the lungs every 6 (six) hours as needed for wheezing or shortness of breath. 6.7 g 2   chlorthalidone  (HYGROTON ) 25 MG tablet Take 1 tablet (25 mg total) by mouth daily. 30 tablet 0   fluticasone  (FLONASE ) 50 MCG/ACT nasal spray Place 1 spray into both nostrils daily. 16 g 2   fluticasone -salmeterol (ADVAIR  DISKUS) 250-50 MCG/ACT AEPB Inhale 1 puff into the lungs in the morning and at bedtime. 60 each 1   montelukast  (SINGULAIR ) 10 MG tablet Take 1 tablet (10 mg total) by mouth daily in the afternoon. 30 tablet 0   predniSONE  (DELTASONE ) 10 MG tablet Take 6 tablets by mouth once daily for 3 days, then 4 tablets once daily for 3 days, then 3 tablets once daily for 3 days, then 2 tablets once daily for 3 days and then 1 tablet once daily for 3 days and stop 48 tablet 0   No facility-administered medications prior to visit.      Review of Systems  Review of Systems   Physical Exam  There were no vitals taken for this visit. Physical Exam  ***  Lab Results:  CBC    Component Value Date/Time   WBC 22.2 (H) 10/21/2023 0320   RBC 5.14 10/21/2023 0320   HGB 13.7 10/21/2023 0320   HCT 45.6 10/21/2023 0320   PLT 271 10/21/2023 0320   MCV 88.7 10/21/2023 0320   MCH 26.7 10/21/2023 0320   MCHC 30.0 10/21/2023 0320   RDW 13.4 10/21/2023 0320   LYMPHSABS 0.8 10/21/2023 0320   MONOABS 0.9 10/21/2023 0320   EOSABS 0.0 10/21/2023 0320   BASOSABS 0.0 10/21/2023 0320    BMET    Component Value Date/Time   NA 139 10/21/2023 0320   K 4.8 10/21/2023 0320   CL 104 10/21/2023 0320   CO2 24 10/21/2023 0320   GLUCOSE 134 (H) 10/21/2023 0320   BUN 12 10/21/2023 0320   CREATININE 0.62 10/21/2023 0320   CALCIUM 10.4 (H) 10/21/2023 0320   GFRNONAA >60 10/21/2023 0320    BNP    Component Value Date/Time   BNP 32.6 01/05/2023 1607    ProBNP No results found for: PROBNP  Imaging: No results found.   Assessment & Plan:    No problem-specific Assessment & Plan notes found for this encounter.   There are no diagnoses linked to this encounter.  Assessment and Plan Assessment & Plan        Almarie LELON Ferrari, NP 12/03/2023
